# Patient Record
Sex: Female | Born: 1985 | Marital: Married | Smoking: Never smoker
Health system: Southern US, Community
[De-identification: ages and names within clinical notes are randomized; demographics above are authoritative.]

## PROBLEM LIST (undated history)

## (undated) DIAGNOSIS — F53 Postpartum depression: Secondary | ICD-10-CM

## (undated) HISTORY — DX: Postpartum depression: F53.0

---

## 2019-02-18 ENCOUNTER — Ambulatory Visit (INDEPENDENT_AMBULATORY_CARE_PROVIDER_SITE_OTHER): Payer: PRIVATE HEALTH INSURANCE | Admitting: Obstetrics & Gynecology

## 2019-02-18 ENCOUNTER — Other Ambulatory Visit: Payer: Self-pay

## 2019-02-18 ENCOUNTER — Encounter: Payer: Self-pay | Admitting: Obstetrics & Gynecology

## 2019-02-18 DIAGNOSIS — O3680X0 Pregnancy with inconclusive fetal viability, not applicable or unspecified: Secondary | ICD-10-CM

## 2019-02-18 DIAGNOSIS — O2340 Unspecified infection of urinary tract in pregnancy, unspecified trimester: Secondary | ICD-10-CM

## 2019-02-18 DIAGNOSIS — Z23 Encounter for immunization: Secondary | ICD-10-CM

## 2019-02-18 DIAGNOSIS — Z3A17 17 weeks gestation of pregnancy: Secondary | ICD-10-CM

## 2019-02-18 DIAGNOSIS — Z3482 Encounter for supervision of other normal pregnancy, second trimester: Secondary | ICD-10-CM | POA: Diagnosis not present

## 2019-02-18 DIAGNOSIS — O2342 Unspecified infection of urinary tract in pregnancy, second trimester: Secondary | ICD-10-CM

## 2019-02-18 DIAGNOSIS — R519 Headache, unspecified: Secondary | ICD-10-CM

## 2019-02-18 DIAGNOSIS — Z113 Encounter for screening for infections with a predominantly sexual mode of transmission: Secondary | ICD-10-CM

## 2019-02-18 DIAGNOSIS — Z3480 Encounter for supervision of other normal pregnancy, unspecified trimester: Secondary | ICD-10-CM | POA: Insufficient documentation

## 2019-02-18 DIAGNOSIS — Z1151 Encounter for screening for human papillomavirus (HPV): Secondary | ICD-10-CM

## 2019-02-18 DIAGNOSIS — O26899 Other specified pregnancy related conditions, unspecified trimester: Secondary | ICD-10-CM

## 2019-02-18 DIAGNOSIS — O99212 Obesity complicating pregnancy, second trimester: Secondary | ICD-10-CM

## 2019-02-18 DIAGNOSIS — Z124 Encounter for screening for malignant neoplasm of cervix: Secondary | ICD-10-CM

## 2019-02-18 DIAGNOSIS — O26892 Other specified pregnancy related conditions, second trimester: Secondary | ICD-10-CM

## 2019-02-18 DIAGNOSIS — O99210 Obesity complicating pregnancy, unspecified trimester: Secondary | ICD-10-CM

## 2019-02-18 LAB — HEPATITIS B SURFACE ANTIGEN: HEPATITIS B SURFACE AG: NEGATIVE

## 2019-02-18 LAB — CBC
HCT: 33.7
HGB: 11
PLATELET COUNT: 377
WBC: 7.6

## 2019-02-18 LAB — RUBELLA VIRUS ANTIBODIES, IGG, SERUM: RUBELLA IGG QUALITATIVE: IMMUNE

## 2019-02-18 LAB — SYPHILIS SCREENING ALGORITHM WITH REFLEX, SERUM: RPR: NONREACTIVE

## 2019-02-18 LAB — HIV1/HIV2 SCREEN, COMBINED ANTIGEN AND ANTIBODY: HIV SCREEN, COMBINED ANTIGEN & ANTIBODY: NONREACTIVE

## 2019-02-18 NOTE — Progress Notes (Signed)
  Subjective:    Helen Hickman is being seen today for her first obstetrical visit.[id number] LMP 10/20/2018.   This is a planned pregnancy. She is at [redacted]w[redacted]d gestation. Her obstetrical history is significant for obesity. Relationship with FOB: spouse, living together. Patient does intend to breast feed. Pregnancy history fully reviewed.  Pt recently moved here from [place]. She has been here for 1 month.  Pt has 68 and 34 yo daughters. She is in school virtually Facilities manager.    Patient reports no complaints.  Review of Systems:   Review of Systems  Objective:     BP 122/76   Pulse 82   Ht 5\' 8"  (1.727 m)   Wt 251 lb 0.3 oz (113.9 kg)   LMP 10/20/2018   BMI 38.17 kg/m  Physical Exam  Exam General Appearance:    Alert, cooperative, no distress, appears stated age  Head:    Normocephalic, without obvious abnormality, atraumatic  Eyes:    conjunctiva/corneas clear, EOM's intact, both eyes  Ears:    Normal external ear canals, both ears  Nose:   Nares normal, septum midline, mucosa normal, no drainage    or sinus tenderness  Throat:   Lips, mucosa, and tongue normal; teeth and gums normal  Neck:   Supple, symmetrical, trachea midline, no adenopathy;    thyroid:  no enlargement/tenderness/nodules  Back:     Symmetric, no curvature, ROM normal, no CVA tenderness  Lungs:     respirations unlabored  Chest Wall:    No tenderness or deformity   Heart:    Regular rate and rhythm  Breast Exam:    No tenderness, masses, or nipple abnormality  Abdomen:     Soft, non-tender, bowel sounds active all four quadrants,    no masses, no organomegaly  Genitalia:    Normal female without lesion, discharge or tenderness   18 week sized uterus.   Extremities:   Extremities normal, atraumatic, no cyanosis or edema  Pulses:   2+ and symmetric all extremities  Skin:   Skin color, texture, turgor normal, no rashes or lesions     Assessment:    Pregnancy: [id number] Patient Active Problem List   Diagnosis Date Noted  . Supervision of other normal pregnancy, antepartum 02/18/2019  . Maternal obesity, antepartum 02/18/2019  . Headache in pregnancy, antepartum 02/18/2019       Plan:     Initial labs drawn. Prenatal vitamins. Problem list reviewed and updated. AFP3 discussed: requested. Role of ultrasound in pregnancy discussed; fetal survey: requested. Amniocentesis discussed: not indicated. Follow up in 4 weeks. 60% of 30 min visit spent on counseling and coordination of care.  Reviewed that there may be female providers at delivery in an emergency. Pt expressed understanding.  Flu shot today   Lavonia Drafts 02/18/2019

## 2019-02-18 NOTE — Patient Instructions (Addendum)

## 2019-02-18 NOTE — Progress Notes (Signed)
DATING AND VIABILITY SONOGRAM   Helen Hickman is a 33 y.o. year old G22P2012 with LMP Patient's last menstrual period was 10/20/2018 (exact date). which would correlate to  [redacted]w[redacted]d weeks gestation.  She has regular menstrual cycles.   She is here today for a confirmatory initial sonogram.    GESTATION: SINGLETON     FETAL ACTIVITY:          Heart rate         162 bpm          The fetus is active.   ADNEXA: The ovaries are normal.   GESTATIONAL AGE AND  BIOMETRICS:  Gestational criteria: Estimated Date of Delivery: 07/27/19 by LMP now at 108w2d  Previous Scans:0  Femur length  2.49 cm  17.3 weeks                                                                                   AVERAGE EGA(BY THIS SCAN): 17.3weeks  WORKING EDD( LMP ):  07-27-2019     TECHNICIAN COMMENTS: Patient informed that the ultrasound is considered a limited obstetric ultrasound and is not intended to be a complete ultrasound exam. Patient also informed that the ultrasound is not being completed with the intent of assessing for fetal or placental anomalies or any pelvic abnormalities. Explained that the purpose of today's ultrasound is to assess for fetal heart rate. Patient acknowledges the purpose of the exam and the limitations of the study.    Kathrene Alu 02/18/2019 3:50 PM

## 2019-02-20 LAB — CYTOLOGY - PAP
Chlamydia: NEGATIVE
Comment: NEGATIVE
Comment: NEGATIVE
Comment: NORMAL
Diagnosis: NEGATIVE
High risk HPV: NEGATIVE
Neisseria Gonorrhea: NEGATIVE

## 2019-02-22 LAB — URINE CULTURE, OB REFLEX

## 2019-02-22 LAB — CULTURE, OB URINE

## 2019-02-24 ENCOUNTER — Telehealth: Payer: Self-pay

## 2019-02-24 MED ORDER — CEPHALEXIN 500 MG PO CAPS: 500.0000 mg | ORAL_CAPSULE | Freq: Four times a day (QID) | ORAL | 0 refills | Status: AC

## 2019-02-24 NOTE — Telephone Encounter (Signed)
Patient called and made aware of UTI results. Patient verified pharmacy and Keflex sent in per protocol. Kathrene Alu RN

## 2019-02-27 LAB — CYSTIC FIBROSIS GENE TEST

## 2019-02-27 LAB — HEMOGLOBINOPATHY EVALUATION
Ferritin: 41 ng/mL (ref 15–150)
Hgb A2 Quant: 2.4 % (ref 1.8–3.2)
Hgb A: 97.6 % (ref 96.4–98.8)
Hgb C: 0 %
Hgb F Quant: 0 % (ref 0.0–2.0)
Hgb S: 0 %
Hgb Solubility: NEGATIVE
Hgb Variant: 0 %

## 2019-02-27 LAB — OBSTETRIC PANEL, INCLUDING HIV
Antibody Screen: NEGATIVE
Basophils Absolute: 0 10*3/uL (ref 0.0–0.2)
Basos: 0 %
EOS (ABSOLUTE): 0.1 10*3/uL (ref 0.0–0.4)
Eos: 1 %
HIV Screen 4th Generation wRfx: NONREACTIVE
Hematocrit: 33.7 % — ABNORMAL LOW (ref 34.0–46.6)
Hemoglobin: 11 g/dL — ABNORMAL LOW (ref 11.1–15.9)
Hepatitis B Surface Ag: NEGATIVE
Immature Grans (Abs): 0 10*3/uL (ref 0.0–0.1)
Immature Granulocytes: 0 %
Lymphocytes Absolute: 1.6 10*3/uL (ref 0.7–3.1)
Lymphs: 20 %
MCH: 28.5 pg (ref 26.6–33.0)
MCHC: 32.6 g/dL (ref 31.5–35.7)
MCV: 87 fL (ref 79–97)
Monocytes Absolute: 0.4 10*3/uL (ref 0.1–0.9)
Monocytes: 5 %
Neutrophils Absolute: 5.6 10*3/uL (ref 1.4–7.0)
Neutrophils: 74 %
Platelets: 377 10*3/uL (ref 150–450)
RBC: 3.86 x10E6/uL (ref 3.77–5.28)
RDW: 12.3 % (ref 11.7–15.4)
RPR Ser Ql: NONREACTIVE
Rh Factor: POSITIVE
Rubella Antibodies, IGG: 4.22 index (ref >0.99)
WBC: 7.6 10*3/uL (ref 3.4–10.8)

## 2019-02-27 LAB — SMN1 COPY NUMBER ANALYSIS (SMA CARRIER SCREENING)

## 2019-02-27 LAB — AFP, SERUM, OPEN SPINA BIFIDA
AFP MoM: 1.05
AFP Value: 30.2 ng/mL
Gest. Age on Collection Date: 17.2 weeks
Maternal Age At EDD: 34.4 yr
OSBR Risk 1 IN: 10000
Test Results:: NEGATIVE
Weight: 251 [lb_av]

## 2019-02-27 LAB — HEMOGLOBIN A1C
Est. average glucose Bld gHb Est-mCnc: 94 mg/dL
Hgb A1c MFr Bld: 4.9 % (ref 4.8–5.6)

## 2019-03-04 ENCOUNTER — Other Ambulatory Visit: Payer: Self-pay | Admitting: Obstetrics & Gynecology

## 2019-03-04 DIAGNOSIS — O2340 Unspecified infection of urinary tract in pregnancy, unspecified trimester: Secondary | ICD-10-CM

## 2019-03-04 MED ORDER — CEPHALEXIN 500 MG PO CAPS: 500.0000 mg | ORAL_CAPSULE | Freq: Three times a day (TID) | ORAL | 0 refills | Status: AC

## 2019-03-10 ENCOUNTER — Ambulatory Visit (HOSPITAL_COMMUNITY)
Admission: RE | Admit: 2019-03-10 | Discharge: 2019-03-10 | Disposition: A | Discharge status: Home / Self Care | Payer: PRIVATE HEALTH INSURANCE | Source: Ambulatory Visit | Attending: Obstetrics & Gynecology | Admitting: Obstetrics & Gynecology

## 2019-03-10 ENCOUNTER — Other Ambulatory Visit (HOSPITAL_COMMUNITY): Payer: Self-pay | Admitting: *Deleted

## 2019-03-10 ENCOUNTER — Telehealth: Payer: Self-pay

## 2019-03-10 ENCOUNTER — Other Ambulatory Visit: Payer: Self-pay

## 2019-03-10 DIAGNOSIS — O99212 Obesity complicating pregnancy, second trimester: Secondary | ICD-10-CM

## 2019-03-10 DIAGNOSIS — Z3480 Encounter for supervision of other normal pregnancy, unspecified trimester: Secondary | ICD-10-CM | POA: Insufficient documentation

## 2019-03-10 DIAGNOSIS — Z3A20 20 weeks gestation of pregnancy: Secondary | ICD-10-CM

## 2019-03-10 DIAGNOSIS — O99210 Obesity complicating pregnancy, unspecified trimester: Secondary | ICD-10-CM | POA: Insufficient documentation

## 2019-03-10 DIAGNOSIS — Z363 Encounter for antenatal screening for malformations: Secondary | ICD-10-CM | POA: Diagnosis not present

## 2019-03-10 IMAGING — US US MFM OB COMP +14 WKS
1 series · 13 of 28 positions shown · non-contrast
Comparison: none

[Series 1: us mfm ob comp +14 wks · 13 of 67 slices shown]
[im 3/67]
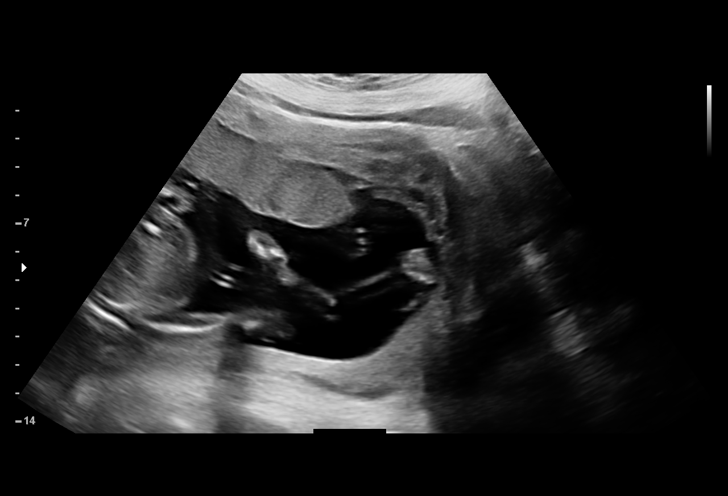
[im 8/67]
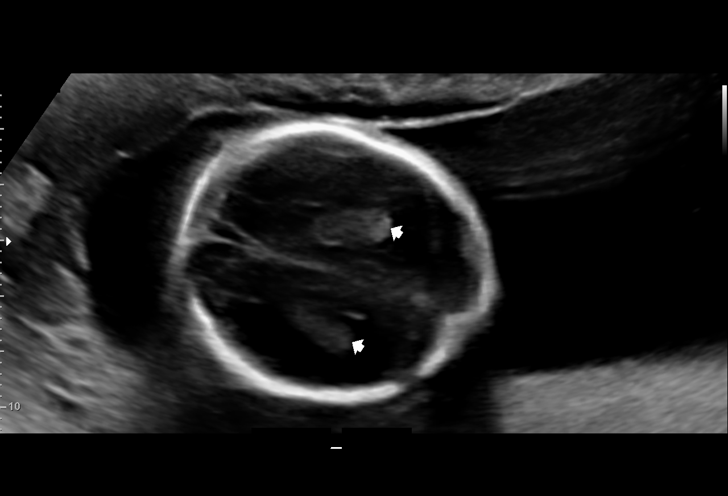
[im 13/67]
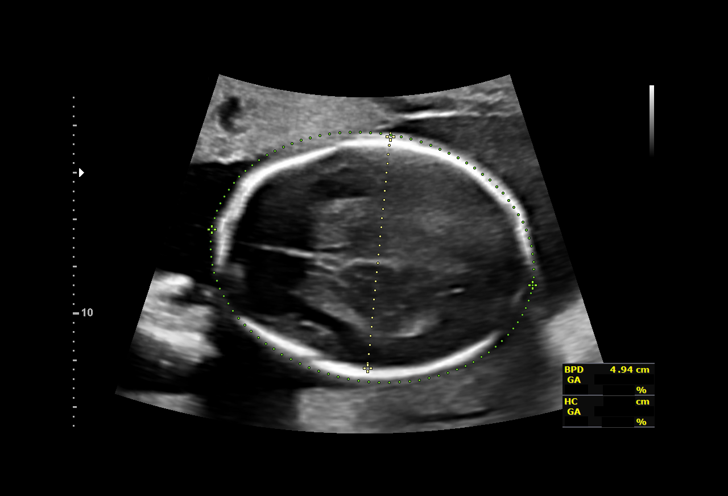
[im 18/67]
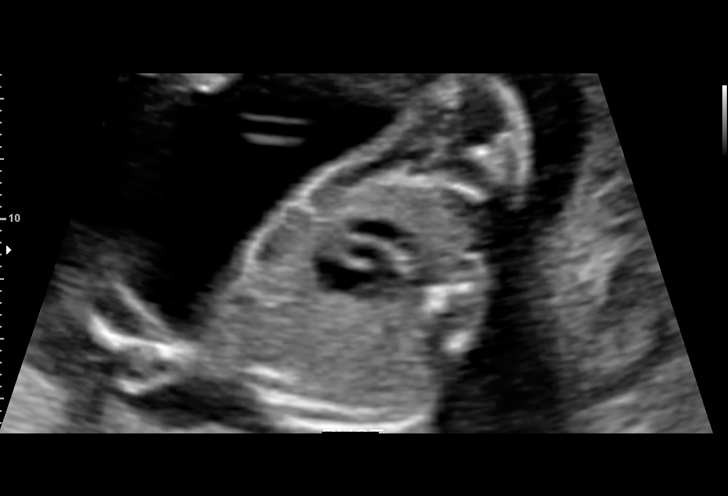
[im 23/67]
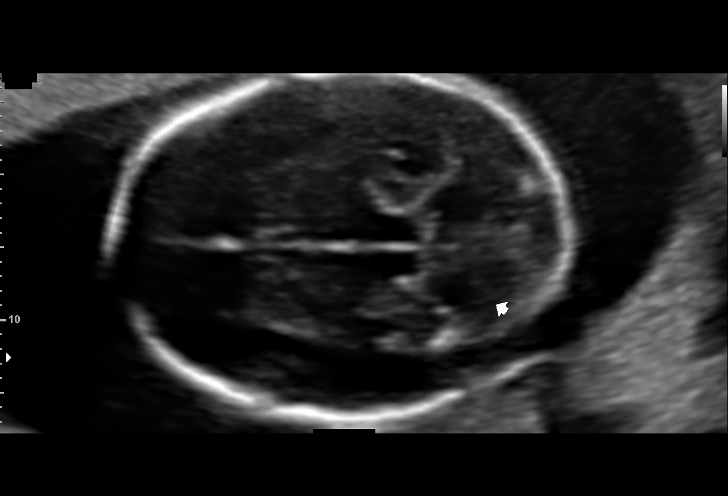
[im 27/67]
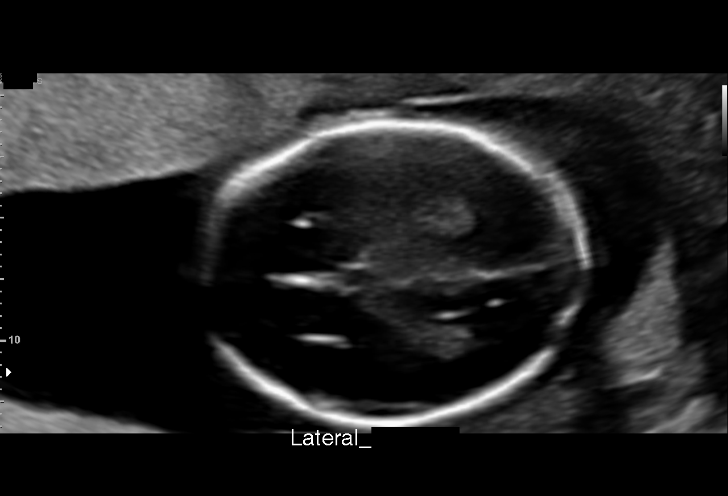
[im 35/67]
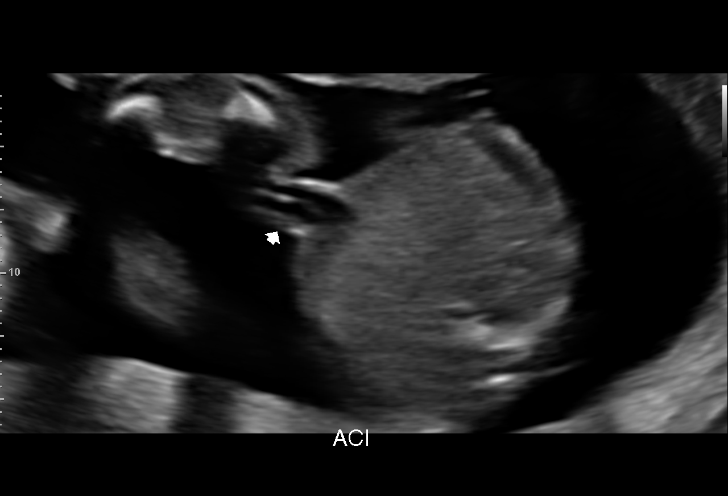
[im 40/67]
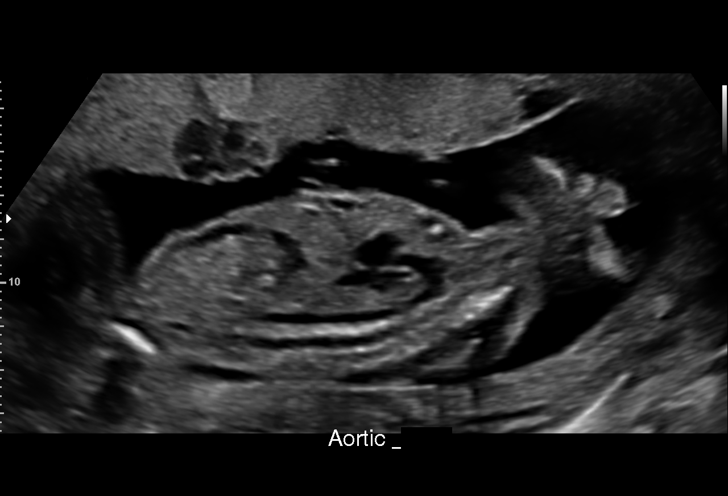
[im 45/67]
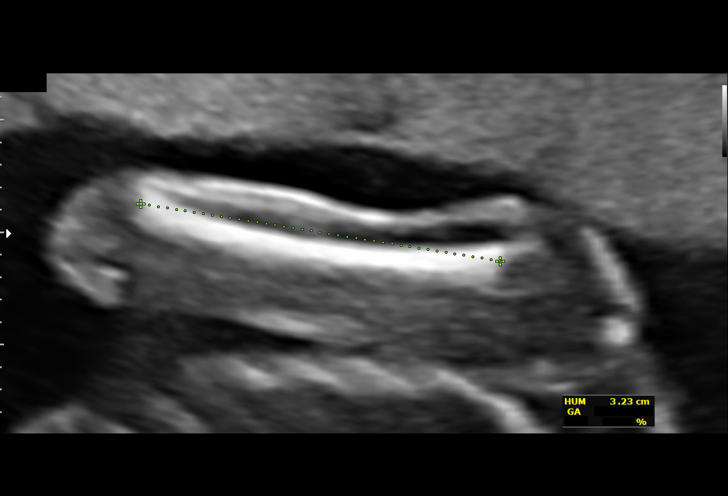
[im 49/67]
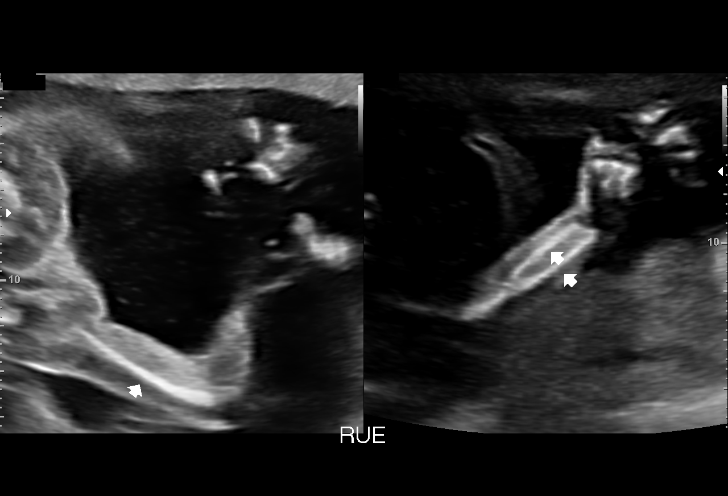
[im 54/67]
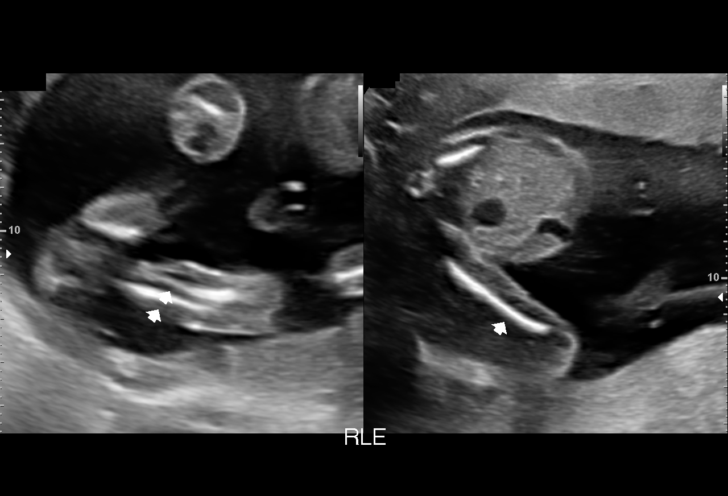
[im 59/67]
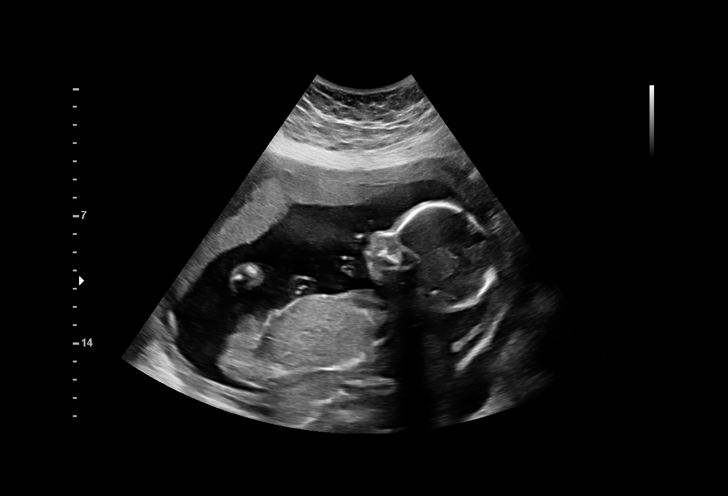
[im 64/67]
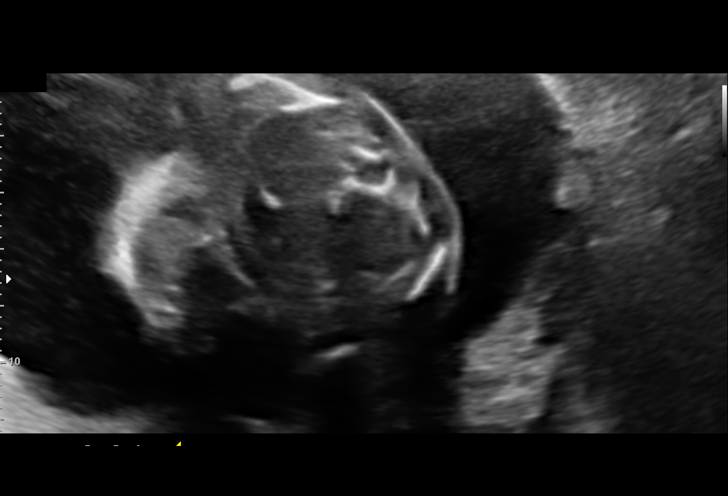

[13 of 28 positions shown; findings below may reference images not displayed]

JOO

                                                       JOO
 ----------------------------------------------------------------------

 ----------------------------------------------------------------------
Indications

  Encounter for antenatal screening for          [NG]
  malformations (Neg AFP)
  20 weeks gestation of pregnancy
  Obesity complicating pregnancy, second         [NG]
  trimester (BMI 38)
 ----------------------------------------------------------------------
Fetal Evaluation

 Num Of Fetuses:         1
 Fetal Heart Rate(bpm):  146
 Cardiac Activity:       Observed
 Presentation:           Breech
 Placenta:               Anterior
 P. Cord Insertion:      Visualized

 Amniotic Fluid
 AFI FV:      Within normal limits

                             Largest Pocket(cm)

Biometry

 BPD:      49.2  mm     G. Age:  20w 6d         79  %    CI:        69.87   %    70 - 86
                                                         FL/HC:      17.8   %    16.8 -
 HC:      187.8  mm     G. Age:  21w 1d         82  %    HC/AC:      1.18        1.09 -
 AC:      159.7  mm     G. Age:  21w 1d         75  %    FL/BPD:     67.9   %
 FL:       33.4  mm     G. Age:  20w 3d         54  %    FL/AC:      20.9   %    20 - 24
 HUM:      32.7  mm     G. Age:  21w 0d         76  %
 CER:      21.8  mm     G. Age:  20w 6d         60  %
 NFT:         4  mm
 LV:        4.3  mm
 CM:        2.9  mm

 Est. FW:     380  gm    0 lb 13 oz      82  %
OB History

 Gravidity:    4         Term:   2         SAB:   1
 Living:       2
Gestational Age

 LMP:           20w 1d        Date:  [DATE]                 EDD:   [DATE]
 U/S Today:     20w 6d                                        EDD:   [DATE]
 Best:          20w 1d     Det. By:  LMP  ([DATE])          EDD:   [DATE]
Anatomy

 Cranium:               Appears normal         Aortic Arch:            Appears normal
 Cavum:                 Appears normal         Ductal Arch:            Appears normal
 Ventricles:            Appears normal         Diaphragm:              Not well visualized
 Choroid Plexus:        Appears normal         Stomach:                Appears normal, left
                                                                       sided
 Cerebellum:            Appears normal         Abdomen:                Appears normal
 Posterior Fossa:       Appears normal         Abdominal Wall:         Appears nml (cord
                                                                       insert, abd wall)
 Nuchal Fold:           Appears normal         Cord Vessels:           Appears normal (3
                                                                       vessel cord)
 Face:                  Appears normal         Kidneys:                Not well visualized
                        (orbits and profile)
 Lips:                  Appears normal         Bladder:                Appears normal
 Thoracic:              Appears normal         Spine:                  Appears normal
 Heart:                 Appears normal         Upper Extremities:      Appears normal
                        (4CH, axis, and
                        situs)
 RVOT:                  Appears normal         Lower Extremities:      Appears normal
 LVOT:                  Appears normal

 Other:  Female gender Heels/feet and open hands/5th digits visualized.
         Technically difficult due to maternal habitus and fetal position.
Cervix Uterus Adnexa

 Cervix
 Length:           4.22  cm.
 Normal appearance by transabdominal scan.
Impression

 We performed fetal anatomy scan. No makers of
 aneuploidies or fetal structural defects are seen. Fetal
 biometry is consistent with her previously-established dates.
 Amniotic fluid is normal and good fetal activity is seen.
 Patient understands the limitations of ultrasound in detecting
 fetal anomalies.
 Maternal obesity imposes limitations on the resolution of
 images, and failure to detect fetal anomalies is more common
 in obese pregnant women.
 I discussed screening for fetal aneuploidies. Patient has a
 prenatal visit appointment next week and will discuss
 screening with you.
Recommendations

 -An appointment was made for her to return in 4 weeks for
 completion of fetal anatomy.
 -Fetal growth assessment at 32 weeks (maternal obesity).
                 JOO

## 2019-03-10 NOTE — Telephone Encounter (Signed)
Called pt regarding Panorama. Pt states she is not able to come in prior to appt for Panorama. Pt states will have labs drawn at office visit on 03/16/18.  Dynastie Knoop l Edouard Gikas, CMA

## 2019-03-10 NOTE — Telephone Encounter (Signed)
-----   Message from Lavonia Drafts, MD sent at 03/10/2019 10:34 AM EST ----- Did this pt get NIPS sent?  I don't see it. Please call her to add. She can come in for the test prior to her next visit.   Thx,  Clh-S

## 2019-03-10 NOTE — Telephone Encounter (Signed)
Pt was confused because the pharmacy called her about her Keflex Rx. Pt made aware that if she has already taken her Keflex, she does not have to take the other Rx. Understanding was voiced.  Helen Hickman, CMA

## 2019-03-13 NOTE — L&D Delivery Note (Signed)
[facility]    Delivery Summary Note      Name: Summer Patel   MRN: [id number]  DOB:  January 11, 1986  Admitted: 07/13/2019 12:19 AM       Pre-delivery diagnosis:    34 y.o. K2I0973 at [redacted]w[redacted]d gestation.   GDMA2  GBS+    Post-delivery diagnosis:    Same plus delivery of a viable neonate.     Findings:           Called to room after patient reported she needed to push. Patient with fetal head crowning and RN at bedside. Tight nuchal noted with spontaneous but slow restitution to ROT. Unable to deliver with gentle guidance and maternal effort. Noted a shoulder dystocia and called out for McRoberts maneuver, Shoulders then delivered with maternal efforts and gentle downward assistance. Baby to maternal abdomen for drying and stimulation. Good tone and cry. Pitocin to IV per protocol. After pulsations of cord ceased, cord double clamped and cut by FOB. Cord blood collected and to table. Placenta delivered by schultz. Complete and intact with 3VC. Fundus firm to massage and pit. Inspection of perineum revealed no lacerations. EBL 250 ml.     Patient somewhat shocked by rapid delivery but doing well. FOB holding baby girl "[name]"    Disposition:  Infant(s) admitted to floor (rooming in with mother).  Team Member Updated:.    "Delivery Information"      IO Blood Loss  07/13/19 0031 - 07/13/19 1326      None                 Carreno, Girl [[id number]]      Labor Events    Rupture date/time: 07/13/2019 1008  Rupture type: Artificial  Fluid color: Clear  Augmentation: AROM              Assisted Delivery Details    Forceps attempted?: No  Vacuum extractor attempted?: No       Shoulder Dystocia Maneuvers    Shoulder dystocia present?: Yes  Gentle attempt at traction, assisted by maternal expulsive forces?: Yes     First maneuver: McRoberts maneuver          Lacerations      Episiotomy: None          Delivery Information    Birth date/time: 07/13/2019 1231  Sex: Female  Delivery type: Vaginal, Spontaneous       Newborn Presentation       Position: Occiput Anterior       Cord Information    Vessels: 3 Vessels  Cord blood disposition: Lab, Cord Segment Sent       Resuscitation    Method: None       Newborn  Apgars    Living status: Living      Skin color:    Heart rate:    Reflex Irrit:    Muscle tone:    Resp. effort:    Total:     1 Min:    0    2    2    2    2    8     5  Min:    1    2    2    2    2    9     10  Min:     15 Min:     20 Min:       Apgars assigned by: RUSH       Skin  to Skin    Skin to skin initiation: 07/13/2019 1312  Skin to skin with: Mother  Breastfeeding initiated: 07/13/2019 1300       Newborn Measurements    Weight: 3790 g  Length: 48.3 cm  Head circumference: 37.5 cm  Chest circumference: 34 cm  Abdominal girth: 35 cm       Placenta    Removal: Spontaneous  Appearance: Delena Bali   Disposition: Discarded       Delivery Providers      Delivering Clinician: Doretha Sou, APRN,MIDWIFE   Provider Role   Jamal Maes, RN Registered Nurse   Fae Pippin, RN Baby Nurse                          Doretha Sou, [place] 07/13/2019, 13:26         I reviewed the APP's note.  I agree with the findings and plan of care as documented in the APP's note.  Any exceptions/additions are edited/noted.    Holley Bouche, MD

## 2019-03-17 ENCOUNTER — Encounter: Payer: PRIVATE HEALTH INSURANCE | Admitting: Advanced Practice Midwife

## 2019-03-24 ENCOUNTER — Encounter: Payer: PRIVATE HEALTH INSURANCE | Admitting: Advanced Practice Midwife

## 2019-04-07 ENCOUNTER — Ambulatory Visit (HOSPITAL_COMMUNITY): Payer: PRIVATE HEALTH INSURANCE

## 2019-04-07 ENCOUNTER — Encounter (HOSPITAL_COMMUNITY): Payer: Self-pay

## 2019-04-09 ENCOUNTER — Telehealth: Payer: Self-pay

## 2019-04-09 ENCOUNTER — Encounter: Payer: PRIVATE HEALTH INSURANCE | Admitting: Family Medicine

## 2019-04-09 NOTE — Telephone Encounter (Signed)
Patient called because she missed her appointment today Jan 28th at 845 am.  Patient called and she states she thought it was tomorrow. I let her know I am unfortunately full tomorrows schedule. Patient states she will call back to reschedule her appointment. Informed her to call as soon as possible to avoid being scheduled much later (since she has now missed a few appointments). Armandina Stammer RN

## 2019-06-05 ENCOUNTER — Encounter (INDEPENDENT_AMBULATORY_CARE_PROVIDER_SITE_OTHER): Payer: Self-pay | Admitting: Advanced Practice Midwife

## 2019-06-09 ENCOUNTER — Ambulatory Visit (INDEPENDENT_AMBULATORY_CARE_PROVIDER_SITE_OTHER): Payer: 59 | Admitting: Primary Care

## 2019-06-09 ENCOUNTER — Other Ambulatory Visit: Payer: Self-pay

## 2019-06-09 ENCOUNTER — Encounter (INDEPENDENT_AMBULATORY_CARE_PROVIDER_SITE_OTHER): Payer: Self-pay | Admitting: Primary Care

## 2019-06-09 ENCOUNTER — Other Ambulatory Visit (INDEPENDENT_AMBULATORY_CARE_PROVIDER_SITE_OTHER): Payer: Self-pay

## 2019-06-09 ENCOUNTER — Encounter (FREE_STANDING_LABORATORY_FACILITY): Payer: 59 | Admitting: Primary Care

## 2019-06-09 ENCOUNTER — Encounter (FREE_STANDING_LABORATORY_FACILITY)
Admit: 2019-06-09 | Discharge: 2019-06-09 | Disposition: A | Discharge status: Home / Self Care | Payer: 59 | Attending: Primary Care | Admitting: Primary Care

## 2019-06-09 VITALS — BP 110/70 | Ht 64.0 in | Wt 283.0 lb

## 2019-06-09 DIAGNOSIS — Z369 Encounter for antenatal screening, unspecified: Secondary | ICD-10-CM | POA: Insufficient documentation

## 2019-06-09 DIAGNOSIS — Z3480 Encounter for supervision of other normal pregnancy, unspecified trimester: Secondary | ICD-10-CM

## 2019-06-09 DIAGNOSIS — Z3A33 33 weeks gestation of pregnancy: Secondary | ICD-10-CM

## 2019-06-09 DIAGNOSIS — Z23 Encounter for immunization: Secondary | ICD-10-CM

## 2019-06-09 DIAGNOSIS — R350 Frequency of micturition: Secondary | ICD-10-CM | POA: Insufficient documentation

## 2019-06-09 DIAGNOSIS — Z113 Encounter for screening for infections with a predominantly sexual mode of transmission: Secondary | ICD-10-CM | POA: Insufficient documentation

## 2019-06-09 DIAGNOSIS — Z363 Encounter for antenatal screening for malformations: Secondary | ICD-10-CM

## 2019-06-09 DIAGNOSIS — Z3483 Encounter for supervision of other normal pregnancy, third trimester: Secondary | ICD-10-CM

## 2019-06-09 DIAGNOSIS — Z3689 Encounter for other specified antenatal screening: Secondary | ICD-10-CM

## 2019-06-09 DIAGNOSIS — Z6710 Type A blood, Rh positive: Secondary | ICD-10-CM | POA: Insufficient documentation

## 2019-06-09 LAB — CBC
HCT: 33.1 % — ABNORMAL LOW (ref 34.8–46.0)
HGB: 10.5 g/dL — ABNORMAL LOW (ref 11.5–16.0)
MCH: 27.9 pg (ref 26.0–32.0)
MCHC: 31.7 g/dL (ref 31.0–35.5)
MCV: 87.8 fL (ref 78.0–100.0)
MPV: 9.8 fL (ref 8.7–12.5)
PLATELETS: 441 10*3/uL — ABNORMAL HIGH (ref 150–400)
RBC: 3.77 10*6/uL — ABNORMAL LOW (ref 3.85–5.22)
RDW-CV: 13.1 % (ref 11.5–15.5)
WBC: 8.6 10*3/uL (ref 3.7–11.0)

## 2019-06-09 LAB — GLUCOSE TOLERANCE TEST (GTT), 1 HOUR: GLUCOSE 1 HR POST DOSE: 217 mg/dL — ABNORMAL HIGH (ref 70–140)

## 2019-06-09 LAB — ABO/RH AND ANTIBODY SCREEN
ABO/RH(D): A POS
ANTIBODY SCREEN: NEGATIVE

## 2019-06-09 NOTE — Nursing Note (Signed)
28 weeks labs drawn and Tdap administered per order Virl Diamond, MA  06/09/2019, 13:42

## 2019-06-09 NOTE — Progress Notes (Addendum)
Obstetrics and [facility] [place] [address]  Phone: [phone]       Chief Complaint   Patient presents with   . Initial Prenatal Visit       HPI: 34 yo G4P2AB1 transferring prenatal care here from [place] and [place]. She is hard to follow, but it sounds as if she may have only had 1 prenatal visit so far this pregnancy. She reports LMP of 10/20/18 which would make her [redacted]w[redacted]d with EDC of 07/27/19.No complaints. Reports fetal movement, denies ROM, vaginal bleeding, contractions. She states she has not completed glucola this pregnancy. She also states she will only see a female provider.     I was able to review outside records prior to her leaving the office. A+, Rubella immune, hep c negative. Rpr negative.  HIV negative. She was treated for UTI.  Hbsag negative. Pap nilm, HPV neg. G/ct negative.   Will do Third trimester labs today and repeat urine culture due to hx of UTI.   From records it does appear she only had 1 visit with them and a "dating" ultrasound was done but she has not had a full anatomy ultrasound. Per records dating sono is consistent with lmp she gave.   OB History   Gravida Para Term Preterm AB Living   4 2 2   1 2    SAB TAB Ectopic Multiple Live Births   1       2      # Outcome Date GA Lbr Len/2nd Weight Sex Delivery Anes PTL Lv   4 Current            3 Term  [redacted]w[redacted]d   F Vag-Spont   LIV   2 Term  [redacted]w[redacted]d   F Vag-Spont   LIV   1 SAB              Genetic Screening    Genetic Screening/Teratology Counseling- Includes patient, baby's father, or anyone in either family with:  Patient's age 25 years or older as of estimated date of delivery: No   Thalassemia ([place], [place], [place], or Asian background): MCV less than 80: No   Neural tube defect (Meningomyelocele, Spina bifida, or Anencephaly): No   Congenital heart defect: No   Down syndrome: No   Tay-Sachs (Ashkenazi Jewish, [place], [place]): No   Canavan disease (Ashkenazi Jewish): No   Familial  dysautonomia (Ashkenazi Jewish): No   Sickle cell disease or trait (African): No   Hemophilia or other blood disorders: No    Cystic fibrosis: No   Glasgow's chorea: No   Mental retardation/autism: No   Other inherited genetic or chromosomal disorder: No   Maternal metabolic disorder (eg. Type 1 diabetes, PKU): No   Patient or baby's father had child with birth defects not listed above: No   Recurrent pregnancy loss, or a stillbirth: No   Medications (including supplements, vitamins, herbs, or OTC drugs)/illicit/recreational drugs/alcohol since last menstrual period: No               ROS: Constitutional: negative  Ears, nose, mouth, throat, and face: negative  Respiratory: negative  Cardiovascular: negative  Gastrointestinal: negative  Genitourinary:negative  Integument/breast: negative  Hematologic/lymphatic: negative  Musculoskeletal:negative  Neurological: negative  Behavioral/Psych: negative  Endocrine: negative  Allergic/Immunologic: negative      Gestational age: [redacted]w[redacted]d    I have reviewed and updated the ACOG as contained in the encounter.    Weight: 128 kg (283 lb)  BP (Non-Invasive): 110/70  Fundal height: 40  Preterm Labor: None  Fetal Movement: Present  Edema: Negative  FHR (1): 160s    Prenatal Physical Exam  No physicals filed.           History reviewed. No pertinent past medical history.  Past Medical History was reviewed and is negative for htn, dm, clotting disorders    Past Surgical History Pertinent Negatives:   Procedure Date Noted   . HX APPENDECTOMY 06/09/2019   . HX BLADDER REPAIR 06/09/2019   . HX BLOOD TRANSFUSION 06/09/2019   . HX BREAST BIOPSY 06/09/2019   . HX CESAREAN SECTION 06/09/2019   . HX CHOLECYSTECTOMY 06/09/2019   . HX CYSTOSCOPY 06/09/2019   . HX HYSTERECTOMY 06/09/2019   . HX TONSILLECTOMY 06/09/2019   . HX TUBAL LIGATION 06/09/2019   . PB POST COLPORRHAPHY,RECTUM/VAGINA 06/09/2019   . PB REPAIR ENTEROCELE,VAG [facility] 06/09/2019   . PB REPR VAGINAL PROLAPSE,SACROSP LIG  06/09/2019   . PB SLING OPER STRES INCONTINENCE 06/09/2019   . URETERAL STENT TO GRAVITY DRAINAGE 06/09/2019         Social History     Socioeconomic History   . Marital status: Married     Spouse name: Not on file   . Number of children: Not on file   . Years of education: Not on file   . Highest education level: Not on file   Tobacco Use   . Smoking status: Never Smoker   . Smokeless tobacco: Never Used   Substance and Sexual Activity   . Alcohol use: Never   . Drug use: Never   . Sexual activity: Yes     Partners: Male     Birth control/protection: None     Social Determinants of Health     Financial Resource Strain:    . Difficulty of Paying Living Expenses:    Food Insecurity:    . Worried About Programme researcher, broadcasting/film/video in the Last Year:    . Barista in the Last Year:    Transportation Needs:    . Freight forwarder (Medical):    Marland Kitchen Lack of Transportation (Non-Medical):    Physical Activity:    . Days of Exercise per Week:    . Minutes of Exercise per Session:    Stress:    . Feeling of Stress :    Intimate Partner Violence:    . Fear of Current or Ex-Partner:    . Emotionally Abused:    Marland Kitchen Physically Abused:    . Sexually Abused:      Family Medical History:     Problem Relation (Age of Onset)    Diabetes Mother, General Family Hx, Other    Heart Disease Father    Hypertension (High Blood Pressure) Father            OB History   Gravida Para Term Preterm AB Living   4 2 2  0 1 2   SAB TAB Ectopic Multiple Live Births   1 0 0 0 2         ICD-10-CM    1. Pregnancy in multigravida  Z34.80 ABO/RH AND ANTIBODY SCREEN     CBC     SYPHILIS SCREENING ALGORITHM WITH REFLEX (TITER, TP-PA), SERUM     GLUCOSE TOLERANCE TEST (GTT), 1 HOUR     OBG TRANSABDOMINAL,AFTER 1ST TRI >14WKS   2. Encounter for antenatal screening of mother  Z36.9 ABO/RH AND ANTIBODY SCREEN  CBC     SYPHILIS SCREENING ALGORITHM WITH REFLEX (TITER, TP-PA), SERUM     GLUCOSE TOLERANCE TEST (GTT), 1 HOUR   3. Increased urinary frequency  R35.0  URINE CULTURE   4. Screening for STD (sexually transmitted disease)  Z11.3 SYPHILIS SCREENING ALGORITHM WITH REFLEX (TITER, TP-PA), SERUM   5. Screening, antenatal, for malformation by ultrasound  Z36.3 OBG US TRANSABDOMINAL,AFTER 1ST TRI >14WKS     Release of records signed and records received prior to her leaving the office.    Third trimester labs today. Tdap given today.  Discussed call coverage and office dynamics, unable to guarantee female provider for delivery. Discussed midwife service at [facility]. States that is too far for her to drive. Will continue care here with [place], if unable to have female at delivery can consider IOL at [facility].   Plan RTC asap for sono growth and anatomy , also fundal height > dates.   Disposition: Return for Sono/ROB asap.    Kennith Maes, APRN,FNP-BC 06/09/2019, 12:04

## 2019-06-09 NOTE — Addendum Note (Signed)
Addended by: Kennith Maes on: 06/09/2019 12:42 PM     Modules accepted: Orders

## 2019-06-10 ENCOUNTER — Encounter (INDEPENDENT_AMBULATORY_CARE_PROVIDER_SITE_OTHER): Payer: Self-pay | Admitting: Primary Care

## 2019-06-10 DIAGNOSIS — Z3490 Encounter for supervision of normal pregnancy, unspecified, unspecified trimester: Secondary | ICD-10-CM | POA: Insufficient documentation

## 2019-06-10 DIAGNOSIS — O24419 Gestational diabetes mellitus in pregnancy, unspecified control: Secondary | ICD-10-CM | POA: Insufficient documentation

## 2019-06-10 DIAGNOSIS — R7309 Other abnormal glucose: Secondary | ICD-10-CM

## 2019-06-10 LAB — SYPHILIS SCREENING ALGORITHM WITH REFLEX (TITER, TP-PA), SERUM: TREPONEMAL AB QUALITATIVE: NONREACTIVE

## 2019-06-10 NOTE — Nursing Note (Signed)
Failed one hour at 217, offered 3hr or fingersticks, advised she would like to do 3hr, will call with appt. Loyal Jacobson, LPN  8/88/9169, 11:20

## 2019-06-11 LAB — URINE CULTURE
URINE CULTURE: 30000
URINE CULTURE: <10000 — AB

## 2019-06-12 ENCOUNTER — Other Ambulatory Visit (INDEPENDENT_AMBULATORY_CARE_PROVIDER_SITE_OTHER): Payer: Self-pay | Admitting: Primary Care

## 2019-06-12 DIAGNOSIS — Z363 Encounter for antenatal screening for malformations: Secondary | ICD-10-CM

## 2019-06-12 DIAGNOSIS — Z3480 Encounter for supervision of other normal pregnancy, unspecified trimester: Secondary | ICD-10-CM

## 2019-06-12 DIAGNOSIS — O99210 Obesity complicating pregnancy, unspecified trimester: Secondary | ICD-10-CM

## 2019-06-16 ENCOUNTER — Other Ambulatory Visit (INDEPENDENT_AMBULATORY_CARE_PROVIDER_SITE_OTHER): Payer: Self-pay | Admitting: Primary Care

## 2019-06-16 ENCOUNTER — Other Ambulatory Visit: Payer: Self-pay

## 2019-06-16 ENCOUNTER — Ambulatory Visit: Payer: 59 | Attending: Primary Care

## 2019-06-16 DIAGNOSIS — R7309 Other abnormal glucose: Secondary | ICD-10-CM | POA: Insufficient documentation

## 2019-06-16 LAB — GLUCOSE 1 HOUR POST DOSE: GLUCOSE 1 HR POST DOSE: 184 mg/dL — ABNORMAL HIGH (ref 120–170)

## 2019-06-16 LAB — GLUCOSE 2 HOUR POST DOSE: GLUCOSE 2 HRS POST  DOSE: 198 mg/dL — ABNORMAL HIGH (ref 70–118)

## 2019-06-16 LAB — GLUCOSE FASTING FOR GTT: GLUCOSE FASTING FOR GTT: 114 mg/dL — ABNORMAL HIGH (ref 70–110)

## 2019-06-16 LAB — GLUCOSE 3 HOUR POST DOSE: GLUCOSE 3 HRS POST  DOSE: 166 mg/dL — ABNORMAL HIGH (ref 70–120)

## 2019-06-16 NOTE — Nursing Note (Signed)
Spoke with pt about failed 3hr, supplies sent to pharmacy, advised how to use, also that she can talk with pharmacist about use or stop in the office is she is uncomfortable, will bring results to appt 06/22/19. Verbalized understanding.   Loyal Jacobson, LPN  11/16/7891, 81:01

## 2019-06-17 ENCOUNTER — Other Ambulatory Visit (INDEPENDENT_AMBULATORY_CARE_PROVIDER_SITE_OTHER): Payer: Self-pay | Admitting: Primary Care

## 2019-06-17 ENCOUNTER — Encounter (INDEPENDENT_AMBULATORY_CARE_PROVIDER_SITE_OTHER): Payer: Self-pay | Admitting: Primary Care

## 2019-06-17 DIAGNOSIS — O24419 Gestational diabetes mellitus in pregnancy, unspecified control: Secondary | ICD-10-CM

## 2019-06-17 MED ORDER — BLOOD SUGAR DIAGNOSTIC STRIPS
ORAL_STRIP | 5 refills | Status: DC
Start: 2019-06-17 — End: 2019-06-22

## 2019-06-17 MED ORDER — LANCING DEVICE WITH LANCETS KIT
PACK | 0 refills | Status: DC
Start: 2019-06-17 — End: 2019-06-22

## 2019-06-17 MED ORDER — BLOOD-GLUCOSE METER
0 refills | Status: DC
Start: 2019-06-17 — End: 2019-06-22

## 2019-06-17 NOTE — Nursing Note (Signed)
Pt is suppose to get a gluclose machine ordered for her   The pharmacy has not gotten the order yet     I spoke to patient, I let her know Kennith Maes CNP just sent that in to the pharmacy, also she put a referral in for a dietician talk with her about diet ect... patient voiced understanding.

## 2019-06-18 ENCOUNTER — Telehealth (INDEPENDENT_AMBULATORY_CARE_PROVIDER_SITE_OTHER): Payer: Self-pay | Admitting: Primary Care

## 2019-06-18 NOTE — Telephone Encounter (Addendum)
Pt was advised of this and she states she will talk about all of this on Monday with Dr.Mace.          ----- Message from Kennith Maes, APRN,FNP-BC sent at 06/18/2019  8:11 AM EDT -----  It is recommended that she get the supplies. We obviously cannot make her. If she has concerns, she has an appointment on 4/12 with Dr. Omar Person.     Kennith Maes, APRN,FNP-BC  06/18/2019, 08:16  ----- Message -----  From: Earl Lites, MA  Sent: 06/17/2019   3:50 PM EDT  To: Kennith Maes, APRN,FNP-BC    Pt called and wanted to know that instead of buying the supplies to test her FS she wants to change her diet first and just check up with [place] each week to make sure her FS is normal.Pt said she is having to pay to much for something she is only going to use it for 3 weeks.Please advise. Thank you.

## 2019-06-19 ENCOUNTER — Encounter (INDEPENDENT_AMBULATORY_CARE_PROVIDER_SITE_OTHER): Payer: Self-pay | Admitting: Primary Care

## 2019-06-19 DIAGNOSIS — B951 Streptococcus, group B, as the cause of diseases classified elsewhere: Secondary | ICD-10-CM | POA: Insufficient documentation

## 2019-06-22 ENCOUNTER — Encounter (INDEPENDENT_AMBULATORY_CARE_PROVIDER_SITE_OTHER): Payer: Self-pay

## 2019-06-22 ENCOUNTER — Encounter (INDEPENDENT_AMBULATORY_CARE_PROVIDER_SITE_OTHER): Payer: Self-pay | Admitting: Obstetrics & Gynecology

## 2019-06-22 ENCOUNTER — Other Ambulatory Visit (INDEPENDENT_AMBULATORY_CARE_PROVIDER_SITE_OTHER): Payer: 59

## 2019-06-22 ENCOUNTER — Other Ambulatory Visit: Payer: Self-pay

## 2019-06-22 ENCOUNTER — Ambulatory Visit (INDEPENDENT_AMBULATORY_CARE_PROVIDER_SITE_OTHER): Payer: 59 | Admitting: Obstetrics & Gynecology

## 2019-06-22 VITALS — BP 124/80 | Wt 285.0 lb

## 2019-06-22 DIAGNOSIS — Z3A35 35 weeks gestation of pregnancy: Secondary | ICD-10-CM

## 2019-06-22 DIAGNOSIS — O99210 Obesity complicating pregnancy, unspecified trimester: Secondary | ICD-10-CM

## 2019-06-22 DIAGNOSIS — R7309 Other abnormal glucose: Secondary | ICD-10-CM

## 2019-06-22 DIAGNOSIS — Z363 Encounter for antenatal screening for malformations: Secondary | ICD-10-CM

## 2019-06-22 DIAGNOSIS — O24419 Gestational diabetes mellitus in pregnancy, unspecified control: Secondary | ICD-10-CM

## 2019-06-22 DIAGNOSIS — Z3480 Encounter for supervision of other normal pregnancy, unspecified trimester: Secondary | ICD-10-CM

## 2019-06-22 DIAGNOSIS — O99213 Obesity complicating pregnancy, third trimester: Secondary | ICD-10-CM

## 2019-06-22 DIAGNOSIS — Z3A38 38 weeks gestation of pregnancy: Secondary | ICD-10-CM

## 2019-06-22 DIAGNOSIS — O99810 Abnormal glucose complicating pregnancy: Secondary | ICD-10-CM

## 2019-06-22 DIAGNOSIS — Z6710 Type A blood, Rh positive: Secondary | ICD-10-CM

## 2019-06-22 MED ORDER — LANCING DEVICE WITH LANCETS KIT
PACK | 0 refills | Status: DC
Start: 2019-06-22 — End: 2019-07-14

## 2019-06-22 MED ORDER — BLOOD-GLUCOSE METER
0 refills | Status: DC
Start: 2019-06-22 — End: 2019-07-14

## 2019-06-22 MED ORDER — BLOOD SUGAR DIAGNOSTIC STRIPS
ORAL_STRIP | 5 refills | Status: DC
Start: 2019-06-22 — End: 2019-07-14

## 2019-06-22 NOTE — Nursing Note (Signed)
Request for High Risk OB consult from: Dr. Omar Person     Reason for request: GDM     Age: 34    Gravida/Para: [id number]     Best EDD: 07/27/19    Current gestational age on: 06/22/19- [redacted]w[redacted]d          Chart information:   Summer Patel was referred by Dr. Omar Person due to GDM.      GDM:    1-hr Glucola 03/30:    217    3-hr GTT 04/06:    Fasting: 114    1-hr: 184    2-hr: 198    3-hr: 166    Note on visit 04/12 states "Has not been checking blood sugars. She stated supplies may be covered by insurance if sent to [facility] pharmacy instead so this was done. She desires IOL prior to 39 weeks. She was advised this can only be done if there is a medical indication and this is a reason for her to be collecting blood sugars. MFM referral placed"   Patient was first seen on 03/30 at [redacted]w[redacted]d as a transfer of care from [facility], note states "She is hard to follow, but it sounds as of she may have only had 1 prenatal visit so far with this pregnancy"    Anatomy scan 04/12- Growth 99%, AC 98%      PMH:    Hx SAB x1:    G1 (No dates listed)    Hx SVD x2:    G2, Term female    G58, Term female    (No dates listed)     Labs, imaging in Epic for review     Schedule:   O MFM consult  O Genetic consult  O Fetal ultrasound      O   singleton    O  Twins   O Triplets   O  _______  O < 14 week   O Nuchal translucency  O Limited  O Anatomy   O Growth (follow up)  O UA Doppler  O MCA Doppler  O Fetal echocardiogram  O Maternal echocardiogram  O Diabetes Education  O Other _______________________________

## 2019-06-22 NOTE — Nursing Note (Signed)
Called to schedule patient for MFM consult. Patient requested appointment in [place], informed her that our only location is [place]. She states she is unable to drive to [facility] and requested a MyChart visit. Scheduled for 04/14 at 9 am.

## 2019-06-22 NOTE — Progress Notes (Signed)
Miriam Liles is a 34 y.o. female currently at [redacted]w[redacted]d      Chief Complaint   Patient presents with    Routine Prenatal Visit   no complaints.  Has not been checking blood sugars. She stated supplies may be covered by insurance if sent to [facility] pharmacy instead so this was done.  She desires iol prior to 39 weeks.  She was advised this can only be done if there is a medical indication and this is a reason for her to be collecting blood sugars.  mfm referral placed.  sono today: growth at 99%; ac98%; largest pocket 7 cm  Will plan for nst and gbs with next visit.    OB History   Gravida Para Term Preterm AB Living   4 2 2  0 1 2   SAB TAB Ectopic Multiple Live Births   1 0 0 0 2       The ACOG flow sheet has been reviewed and updated.     Weight: 129 kg (285 lb)  BP (Non-Invasive): 124/80      ICD-10-CM    1. Pregnancy in multigravida  Z34.80    2. GTT (glucose tolerance test) abnormal  R73.09 FETAL NON STRESS TEST (AMB ONLY)   3. Gestational diabetes mellitus (GDM)  O24.419 Refer to [facility] High Risk OB       Return in about 1 week (around 06/29/2019) for ROB with NST.    07/01/2019, MD 06/22/2019, 10:26

## 2019-06-23 ENCOUNTER — Ambulatory Visit (INDEPENDENT_AMBULATORY_CARE_PROVIDER_SITE_OTHER): Payer: Self-pay | Admitting: Primary Care

## 2019-06-23 ENCOUNTER — Encounter (INDEPENDENT_AMBULATORY_CARE_PROVIDER_SITE_OTHER): Payer: Self-pay | Admitting: Obstetrics & Gynecology

## 2019-06-23 NOTE — Telephone Encounter (Signed)
Spoke with pt about FS values, she is scheduled for appt with MFM tomorrow. Loyal Jacobson, LPN  4/49/6759, 15:49

## 2019-06-23 NOTE — Telephone Encounter (Signed)
-----   Message from [employer] sent at 06/22/2019  4:29 PM EDT -----  Kennith Maes, APRN,FNP-BC Pt     Pt is stating that she would like to speak to a nurse, she states she has to ask a couple questions. Pt didn't give more information. Please advise.    Thank you,  Byrd Hesselbach

## 2019-06-24 ENCOUNTER — Encounter (INDEPENDENT_AMBULATORY_CARE_PROVIDER_SITE_OTHER): Payer: Self-pay

## 2019-06-24 ENCOUNTER — Other Ambulatory Visit: Payer: Self-pay

## 2019-06-24 ENCOUNTER — Ambulatory Visit: Payer: 59 | Attending: Obstetrics & Gynecology

## 2019-06-24 DIAGNOSIS — O360930 Maternal care for other rhesus isoimmunization, third trimester, not applicable or unspecified: Secondary | ICD-10-CM

## 2019-06-24 DIAGNOSIS — Z3A35 35 weeks gestation of pregnancy: Secondary | ICD-10-CM | POA: Insufficient documentation

## 2019-06-24 DIAGNOSIS — O24410 Gestational diabetes mellitus in pregnancy, diet controlled: Secondary | ICD-10-CM

## 2019-06-24 DIAGNOSIS — O24419 Gestational diabetes mellitus in pregnancy, unspecified control: Secondary | ICD-10-CM | POA: Insufficient documentation

## 2019-06-24 DIAGNOSIS — Z6710 Type A blood, Rh positive: Secondary | ICD-10-CM

## 2019-06-24 NOTE — Progress Notes (Signed)
[facility]   MFM Consultation    TELEMEDICINE CONSULTATION    Patient Location: MyChart video visit from home address: [address]  [place] [address]  Patient/family aware of provider location: yes  Patient/family consent for telemedicine: yes  Examination observed and performed by: Sande Rives, MD    GT Modifier -- this consult was performed via telemedicine.  Date of Service: 06/24/2019    I was ask to see this patient because she has gestational diabetes    Impressions    Gestational diabetes diagnosed later in pregnancy   Pregnancy with estimated fetal weight at 99th %tile at 35 0/7 weeks (2 days ago)   Patient just starting checking FSBG yesterday                             Recommendations    Patient to log FSBG for 1 week   Goals of therapy   Fasting < 95 mg/dl   2 hour postprandial < 120 mg/dl   With the patient, we will review her log    If > 50% of glucoses are above target, we will start insulin    If insulin needed   Start antenatal testing twice weekly   Delivery at 38 weeks   If diet controls FSBG, delivery at 39 weeks   1-2 days before scheduled delivery, get ultrasound to assess fetal weight   If EFW > 4500g, offer cesarean delivery    History and pregnancy course reviewed  1 hour GCT on 3/30 = 217  3 hour GTT on 4/6 = 114/184/198/166  Ultrasound on 4/12 -- EFW = 3314g (99th >tile)      Meds  Current Outpatient Medications   Medication Sig    Blood Sugar Diagnostic Strip Fasting and 2hr after meals for a total of 4x daily    Blood-Glucose Meter Misc Device covered by insurance    Lancing Device with Lancets Kit Fasting and 2hr after meals for a total of 4x daily    prenatal vitamin-iron-folate Tablet Take 1 Tablet by mouth Once a day     Allergies --  Allergy History as of 06/24/19      No Known Allergies            No past surgical history on file.    On the day of the encounter, a total of  20 minutes was spent on this  patient encounter including review of historical information, examination, documentation and post-visit activities.       GT Modifier -- this consult was performed via telemedicine.    Kerby Moors, MD

## 2019-06-29 ENCOUNTER — Ambulatory Visit (INDEPENDENT_AMBULATORY_CARE_PROVIDER_SITE_OTHER): Payer: 59 | Admitting: Obstetrics & Gynecology

## 2019-06-29 ENCOUNTER — Encounter (FREE_STANDING_LABORATORY_FACILITY): Payer: 59 | Admitting: Obstetrics & Gynecology

## 2019-06-29 ENCOUNTER — Encounter (FREE_STANDING_LABORATORY_FACILITY)
Admit: 2019-06-29 | Discharge: 2019-06-29 | Disposition: A | Discharge status: Home / Self Care | Payer: 59 | Attending: Obstetrics & Gynecology | Admitting: Obstetrics & Gynecology

## 2019-06-29 ENCOUNTER — Ambulatory Visit (INDEPENDENT_AMBULATORY_CARE_PROVIDER_SITE_OTHER): Payer: 59

## 2019-06-29 ENCOUNTER — Encounter (INDEPENDENT_AMBULATORY_CARE_PROVIDER_SITE_OTHER): Payer: Self-pay

## 2019-06-29 ENCOUNTER — Encounter (INDEPENDENT_AMBULATORY_CARE_PROVIDER_SITE_OTHER): Payer: Self-pay | Admitting: Obstetrics & Gynecology

## 2019-06-29 ENCOUNTER — Other Ambulatory Visit: Payer: Self-pay

## 2019-06-29 VITALS — BP 122/76 | Ht 66.0 in | Wt 283.0 lb

## 2019-06-29 DIAGNOSIS — O24419 Gestational diabetes mellitus in pregnancy, unspecified control: Secondary | ICD-10-CM

## 2019-06-29 DIAGNOSIS — Z369 Encounter for antenatal screening, unspecified: Secondary | ICD-10-CM | POA: Insufficient documentation

## 2019-06-29 DIAGNOSIS — O99810 Abnormal glucose complicating pregnancy: Secondary | ICD-10-CM

## 2019-06-29 DIAGNOSIS — Z3A36 36 weeks gestation of pregnancy: Secondary | ICD-10-CM

## 2019-06-29 DIAGNOSIS — Z3480 Encounter for supervision of other normal pregnancy, unspecified trimester: Secondary | ICD-10-CM

## 2019-06-29 DIAGNOSIS — R7309 Other abnormal glucose: Secondary | ICD-10-CM

## 2019-06-29 NOTE — Progress Notes (Signed)
Summer Patel is a 34 y.o. female currently at [redacted]w[redacted]d      Chief Complaint   Patient presents with    Routine Prenatal Visit   saw mfm, reports blood sugars improved now that she is fasting for Ramadan.  nst reactive  gbs collected.  Cervix closed.  iol scheduled for 5/2 8 pm for gdm.    OB History   Gravida Para Term Preterm AB Living   4 2 2  0 1 2   SAB TAB Ectopic Multiple Live Births   1 0 0 0 2       The ACOG flow sheet has been reviewed and updated.     Weight: 128 kg (283 lb)  BP (Non-Invasive): 122/76      ICD-10-CM    1. Gestational diabetes mellitus (GDM)  O24.419 OBG TRANSABDOMINAL, FOLLOW UP     FETAL NON STRESS TEST (AMB ONLY)   2. Pregnancy in multigravida  Z34.80    3. Prenatal screening encounter  Z36.9 GROUP B STREPTOCOCCUS DNA BY PCR       Return in about 1 week (around 07/06/2019) for ROB with NST.    07/08/2019, MD 06/29/2019, 10:45

## 2019-06-29 NOTE — Progress Notes (Signed)
Reactive nst

## 2019-06-29 NOTE — Procedures (Signed)
NST today in office for gdm:   Baseline: 120  Moderate variability   + Accels  No Decels  Toco: none   Interpretation: reactive NST    Lynden Oxford, MD 06/29/2019, 10:58

## 2019-06-30 ENCOUNTER — Ambulatory Visit (HOSPITAL_COMMUNITY): Payer: Self-pay | Admitting: Registered"

## 2019-07-01 ENCOUNTER — Encounter (INDEPENDENT_AMBULATORY_CARE_PROVIDER_SITE_OTHER): Payer: Self-pay

## 2019-07-01 ENCOUNTER — Other Ambulatory Visit: Payer: Self-pay

## 2019-07-01 ENCOUNTER — Ambulatory Visit: Payer: 59 | Attending: NURSE PRACTITIONER | Admitting: NURSE PRACTITIONER

## 2019-07-01 DIAGNOSIS — O99820 Streptococcus B carrier state complicating pregnancy: Secondary | ICD-10-CM | POA: Insufficient documentation

## 2019-07-01 DIAGNOSIS — Z3A36 36 weeks gestation of pregnancy: Secondary | ICD-10-CM

## 2019-07-01 DIAGNOSIS — O0990 Supervision of high risk pregnancy, unspecified, unspecified trimester: Secondary | ICD-10-CM

## 2019-07-01 DIAGNOSIS — O3663X0 Maternal care for excessive fetal growth, third trimester, not applicable or unspecified: Secondary | ICD-10-CM | POA: Insufficient documentation

## 2019-07-01 DIAGNOSIS — O24410 Gestational diabetes mellitus in pregnancy, diet controlled: Secondary | ICD-10-CM

## 2019-07-01 DIAGNOSIS — O24419 Gestational diabetes mellitus in pregnancy, unspecified control: Secondary | ICD-10-CM | POA: Insufficient documentation

## 2019-07-01 DIAGNOSIS — Z789 Other specified health status: Secondary | ICD-10-CM

## 2019-07-01 DIAGNOSIS — R8271 Bacteriuria: Secondary | ICD-10-CM

## 2019-07-01 DIAGNOSIS — O3660X0 Maternal care for excessive fetal growth, unspecified trimester, not applicable or unspecified: Secondary | ICD-10-CM

## 2019-07-01 LAB — GROUP B STREPTOCOCCUS DNA BY PCR: GROUP B STREPTOCOCCUS (GBS) DNA BY PCR: NEGATIVE

## 2019-07-01 NOTE — Progress Notes (Signed)
[facility] Department of Obstetrics and Gynecology  Maternal-Fetal Medicine Clinic  [address]   [place], [place]  [address]  Phone:  [phone]  Fax:  [phone]    Telemedicine Consultation via [place] Visit    Name: Summer Patel  MRN: [id number]  DOB: 1985/05/15    Date of Service:  07/01/2019     TELEMEDICINE DOCUMENTATION:  Patient Location:  MyChart video visit from home address: [address] [place] [address]   Patient/family aware of provider location:  Yes  Patient/family consent for telemedicine:  Yes  Visit performed and examination observed by: Vivianne Spence, APRN FNP-BC    Patient is a 34 y.o. [id number] at 98w2dfor RFM telemedicine visit.    [facility] [place]    She is seen in the MFM clinic for gestational diabetes and fetal macrosomia.      She denies vaginal bleeding and loss of fluid, and contractions.  Reports good fetal movement.      EDD:  07/27/2019, by Last Menstrual Period    Review of Systems  Constitutional:  Denies fever and weight loss  Eye- Denies blurry vision and vision changes  Cardio- Denies chest pain, palpitations, and irregular heartbeat  Resp- Denies shortness of breath, cough, and wheezing  GI- Denies abdominal pain, nausea, vomiting, diarrhea, and constipation  GU- Denies dysuria and hematuria  Neuro- + intermittent headaches no worse than baseline; reports history of migraines.  Denies syncope    No past medical history on file.    OB History     Gravida   4    Para   2    Term   2    Preterm        AB   1    Living   2       SAB   1    TAB        Ectopic        Multiple        Live Births   2           # Outc Date GA Lbr Len/2nd Wgt Sex Del Anes PTL Lv    1 SAB                 2 Term  335w0d F Vag-Spont   Living        3 Term  391w2dF Vag-Spont   Living        4 Current                     Social History-  Tobacco Use- Denies  Alcohol Use- Denies  THC Use- Denies  Drug Use-  Denies    Allergies-  No Known Allergies    Medications:  Outpatient Medications Marked as Taking for the 07/01/19 encounter (Telemedicine) with Nycole Kawahara, CasGarcenoPRN,FNP-BC   Medication Sig    Blood Sugar Diagnostic Strip Fasting and 2hr after meals for a total of 4x daily    Blood-Glucose Meter Misc Device covered by insOncologistvice with Lancets Kit Fasting and 2hr after meals for a total of 4x daily    prenatal vitamin-iron-folate Tablet Take 1 Tablet by mouth Once a day     Exam  General:  Patient in no acute distress, alert  Psych:  Mood good, calm, cooperative  Assessment/Plan    Gestational Diabetes   Fetal Macrosomia  -Denies history of gestational diabetes  -1 hour gtt 217 on 06/09/2019  -3 hour gtt on 06/16/2019:  Fasting 114, 1 hour 184, 2 hour 198, 3 hour 166  -S/p initial maternal-fetal medicine consultation with Dr. Patric Dykes on 06/24/2019  -Discussed blood glucose goals in pregnancy:   Fasting- <95   2 hours postprandial- <120  -Last ultrasound for fetal growth on 06/22/2019:   EFW- 3314 gm, 99%tile   HC- >99%tile, AC- 98%tile, FL- 93%tile, HUM- 80%tile  -Reports other babies weighted 7.8 lbs and 8 lbs at birth  -No current medication  -Is eating from 7pm-6am  -Fasting is checked around 7-8pm, 2 hours post first meal at 1:30am, and 2 hours post 2nd meal at 6:00am  -Blood glucose readings per log:  06/23/2019- F 80, 2 hr 129, 2 hr 126  06/24/2019- F 90, 2 hr 126, 2hr 90  06/25/2019- F 83, 2 hr 132, 2 hr 124  06/26/2019- F 80, 2 hr 108, 2 hr 130  06/27/2019- F 70, 2 hr, 94, 2 hr 90  06/28/2019- F 75, 2 hr 98, 2 hr 120  06/29/2019- F 75, 2 hr 100, 2 hr 102  06/30/2019- F 80, 2 hr 93, 2 hr 120  -Recommendations/Plan:   Discussed the following:    All fastings are at goal    The last five 2 hours post first meal readings are at goal    5/8 2 hours post second meal readings are not at goal   Discussed bolus insulin therapy with her 2nd meal.  Discussed that first line treatment for uncontrolled blood  glucose readings in pregnancy is insulin therapy.  She declines insulin therapy at this time   Continue to check every fasting and 2 hours post meal reading and keep a log; recommend MyChart video visit in 1 week for blood glucose log review and she is agreeable   Ultrasound for fetal growth every 3-4 weeks; next is scheduled for 07/09/2019.  Per Dr. Patric Dykes, if EFW > 4500 gm, offer cesarean delivery; she should be counseled by primary OB at her appointment if EFW >4500 gm   Twice weekly antenatal surveillance   Dr. Patric Dykes previously recommended deliver at 38 weeks (39 weeks if blood glucose readings are well-controlled).  Agree with plan for delivery at 38 weeks since her readings are not all at goal.  She is currently scheduled for an IOL at [facility] on 07/13/2019 at 8:00pm.  She requests only female providers for her care.  She states today she would like to deliver at [facility] because she was told by primary OB she would be more likely to have care by a female provider here.  She is new to the area and lives with her husband and her 2 daughters (ages 60 and 46).  She asks if her husband and daughters can be with her during induction/labor/postpartum.  I reviewed her situation with Dr. Derrel Nip, who states that she may only have 1 visitor for labor/postpartum and that children are not allowed in the hospital.  I reviewed the visitor policy with [place].  She verbalizes understanding of visitation policy and that her daughters will not be allowed in the hospital.  She requests the 0800 induction on 07/13/2019 but that slot is taken.  IOL scheduled at [facility] on 07/13/2019 at 0000 per her request.  IOL paperwork/instructions were texted to her.     2 hour gtt  at 6 week postpartum visit    GBS Bacteriuria  -Urine culture on 06/09/2019 with GBS bacteria; was treated with Augmentin  -Reviewed preterm labor precautions    Routine Prenatal Care  -Primary OBs are [facility] providers in  [place]  -Prenatal labs:   A positive, antibody screen negative, H/H 11.0/33.7   Rubella immune, RPR non-reactive, HBsAg negative, HIV negative, Gc/chlamydia not detected   A1C 4.9  -Hemoglobinopathy evaluation negative  -Genetic screening:   AFP negative   CF carrier screening negative   SMA carrier screening negative  -Last pap documented as negative with HR HPV negative but no date on result  -Continue prenatal vitamin  -Received Tdap on 06/09/2019  -Reviewed fetal kick counts  -Reviewed preterm labor precautions  -Reviewed s/s preeclampsia and to report if occur    Follow-up with primary OB as directed.  She has an NST only scheduled on 07/06/2019 and an [place] appointment, growth ultrasound, and NST on 07/09/2019    Follow-up with MFM:   MyChart Video Visit on 07/07/2019 to review blood glucose log    I spent greater than 50% of 83 minute telemedicine appointment counseling patient on above plan of care    Patient seen independently with maternal-fetal medicine physician available for consultation.      [employer] APRN FNP-BC

## 2019-07-02 DIAGNOSIS — Z789 Other specified health status: Secondary | ICD-10-CM | POA: Insufficient documentation

## 2019-07-06 ENCOUNTER — Other Ambulatory Visit: Payer: Self-pay

## 2019-07-06 ENCOUNTER — Ambulatory Visit (INDEPENDENT_AMBULATORY_CARE_PROVIDER_SITE_OTHER): Payer: 59

## 2019-07-06 ENCOUNTER — Encounter (INDEPENDENT_AMBULATORY_CARE_PROVIDER_SITE_OTHER): Payer: Self-pay

## 2019-07-06 DIAGNOSIS — O24419 Gestational diabetes mellitus in pregnancy, unspecified control: Secondary | ICD-10-CM

## 2019-07-06 DIAGNOSIS — Z3A37 37 weeks gestation of pregnancy: Secondary | ICD-10-CM

## 2019-07-06 NOTE — Procedures (Signed)
Obstetrics and [facility]  [address]   [place] [address]   Phone: [phone]   NST today:   Baseline: 120  Moderate variability   + Accels to 150s  No Decels  Toco: no contractions   Interpretation: reactive NST    Kennith Maes, APRN,FNP-BC 07/06/2019, 11:59

## 2019-07-07 ENCOUNTER — Ambulatory Visit (INDEPENDENT_AMBULATORY_CARE_PROVIDER_SITE_OTHER): Payer: 59 | Admitting: NURSE PRACTITIONER

## 2019-07-09 ENCOUNTER — Other Ambulatory Visit (INDEPENDENT_AMBULATORY_CARE_PROVIDER_SITE_OTHER): Payer: 59

## 2019-07-09 ENCOUNTER — Ambulatory Visit (INDEPENDENT_AMBULATORY_CARE_PROVIDER_SITE_OTHER): Payer: 59 | Admitting: Nurse Practitioner

## 2019-07-09 ENCOUNTER — Other Ambulatory Visit: Payer: Self-pay

## 2019-07-09 ENCOUNTER — Encounter (INDEPENDENT_AMBULATORY_CARE_PROVIDER_SITE_OTHER): Payer: Self-pay | Admitting: Nurse Practitioner

## 2019-07-09 VITALS — BP 116/78 | Ht 68.0 in | Wt 281.0 lb

## 2019-07-09 DIAGNOSIS — O3660X0 Maternal care for excessive fetal growth, unspecified trimester, not applicable or unspecified: Secondary | ICD-10-CM

## 2019-07-09 DIAGNOSIS — Z3A37 37 weeks gestation of pregnancy: Secondary | ICD-10-CM

## 2019-07-09 DIAGNOSIS — O24419 Gestational diabetes mellitus in pregnancy, unspecified control: Secondary | ICD-10-CM

## 2019-07-09 DIAGNOSIS — B951 Streptococcus, group B, as the cause of diseases classified elsewhere: Secondary | ICD-10-CM

## 2019-07-09 DIAGNOSIS — Z789 Other specified health status: Secondary | ICD-10-CM

## 2019-07-09 DIAGNOSIS — O24410 Gestational diabetes mellitus in pregnancy, diet controlled: Secondary | ICD-10-CM

## 2019-07-09 DIAGNOSIS — O2343 Unspecified infection of urinary tract in pregnancy, third trimester: Secondary | ICD-10-CM

## 2019-07-09 NOTE — Progress Notes (Signed)
NOTE AUTHORED BY Glenis Smoker, MSN, APRN, [facility] - per [facility] requirements.    Chief Complaint   Patient presents with    Routine Prenatal Visit     33yo [phone] in clinic today for ROB visit at [redacted]W[redacted]D in office. Pt denies s/sx UIT/PIH/Labor. Fetal movemenet is present per pt. Pt c/o Braxton hicks contractions, denies ROM/VB. Pt has sch induction at [facility] on 07/13/2019. Growth scan today for GDM. Pt states all BS are within range. Pt desires female providers only.      OB History   Gravida Para Term Preterm AB Living   4 2 2   1 2    SAB TAB Ectopic Multiple Live Births   1       2      # Outcome Date GA Lbr Len/2nd Weight Sex Delivery Anes PTL Lv   4 Current            3 Term  [redacted]w[redacted]d   F Vag-Spont   LIV   2 Term  [redacted]w[redacted]d   F Vag-Spont   LIV   1 SAB              Weight: 127 kg (281 lb)  BP (Non-Invasive): 116/78  Fundal height: 96%  Preterm Labor: None  Fetal Movement: Present  Presentation: Cephalic  Edema: Negative  FHR (1): 137 sono  Comments: Anterior mid grade 0 placenta, nml AFI, EFW 33803gm, 109W3D, liited anatomy 2/2 habitus      ICD-10-CM    1. Fetal macrosomia affecting management of mother, antepartum  O36.60X0    2. Group B Streptococcus urinary tract infection affecting pregnancy in third trimester  O23.43     B95.1    3. Gestational diabetes  O24.419    4. History of religious or cultural beliefs affecting care  Z78.9      Pt ed on fetal kick count, s/sx UTI/PIH/Labor, and when to seek emergent care reviewed. Ed on ligament pain,body mechanics, and encouraged abdominal support band. Pt ed on COVID-19 recommendations on social distancing and need to self quarantine until deliver other than her visits    Disposition: No follow-ups on file.    [redacted]w[redacted]d, NP  07/09/2019, 12:22

## 2019-07-09 NOTE — Nursing Note (Signed)
Per provider the NST did not need to be done today because the patient had an ultrasound Sherre Poot, Ambulatory Care Assistant  07/09/2019, 12:56

## 2019-07-12 ENCOUNTER — Encounter (HOSPITAL_COMMUNITY): Payer: Self-pay

## 2019-07-13 ENCOUNTER — Encounter (HOSPITAL_COMMUNITY): Payer: Self-pay | Admitting: NURSE PRACTITIONER

## 2019-07-13 ENCOUNTER — Encounter (HOSPITAL_COMMUNITY): Payer: Self-pay

## 2019-07-13 ENCOUNTER — Inpatient Hospital Stay
Admission: RE | Admit: 2019-07-13 | Discharge: 2019-07-14 | DRG: 807 | Disposition: A | Discharge status: Home / Self Care | Payer: 59 | Source: Ambulatory Visit | Attending: Obstetrics & Gynecology | Admitting: Obstetrics & Gynecology

## 2019-07-13 ENCOUNTER — Other Ambulatory Visit: Payer: Self-pay

## 2019-07-13 ENCOUNTER — Inpatient Hospital Stay (HOSPITAL_COMMUNITY): Payer: 59 | Admitting: Obstetrics & Gynecology

## 2019-07-13 ENCOUNTER — Inpatient Hospital Stay (HOSPITAL_COMMUNITY): Payer: 59

## 2019-07-13 DIAGNOSIS — Z833 Family history of diabetes mellitus: Secondary | ICD-10-CM

## 2019-07-13 DIAGNOSIS — Z3A38 38 weeks gestation of pregnancy: Secondary | ICD-10-CM

## 2019-07-13 DIAGNOSIS — O2340 Unspecified infection of urinary tract in pregnancy, unspecified trimester: Secondary | ICD-10-CM

## 2019-07-13 DIAGNOSIS — Z20822 Contact with and (suspected) exposure to covid-19: Secondary | ICD-10-CM | POA: Diagnosis present

## 2019-07-13 DIAGNOSIS — O99824 Streptococcus B carrier state complicating childbirth: Secondary | ICD-10-CM | POA: Diagnosis present

## 2019-07-13 DIAGNOSIS — R8271 Bacteriuria: Secondary | ICD-10-CM | POA: Diagnosis present

## 2019-07-13 DIAGNOSIS — O24419 Gestational diabetes mellitus in pregnancy, unspecified control: Secondary | ICD-10-CM

## 2019-07-13 DIAGNOSIS — O24429 Gestational diabetes mellitus in childbirth, unspecified control: Principal | ICD-10-CM | POA: Diagnosis present

## 2019-07-13 DIAGNOSIS — Z79899 Other long term (current) drug therapy: Secondary | ICD-10-CM

## 2019-07-13 DIAGNOSIS — Z3490 Encounter for supervision of normal pregnancy, unspecified, unspecified trimester: Secondary | ICD-10-CM | POA: Diagnosis present

## 2019-07-13 DIAGNOSIS — B951 Streptococcus, group B, as the cause of diseases classified elsewhere: Secondary | ICD-10-CM

## 2019-07-13 LAB — CBC
HCT: 32.5 % — ABNORMAL LOW (ref 34.8–46.0)
HGB: 10.3 g/dL — ABNORMAL LOW (ref 11.5–16.0)
MCH: 27.8 pg (ref 26.0–32.0)
MCHC: 31.7 g/dL (ref 31.0–35.5)
MCV: 87.6 fL (ref 78.0–100.0)
MPV: 10.1 fL (ref 8.7–12.5)
PLATELETS: 424 10*3/uL — ABNORMAL HIGH (ref 150–400)
RBC: 3.71 10*6/uL — ABNORMAL LOW (ref 3.85–5.22)
RDW-CV: 15.2 % (ref 11.5–15.5)
WBC: 5.5 10*3/uL (ref 3.7–11.0)

## 2019-07-13 LAB — DRUG SCREEN, NO CONFIRMATION, URINE
AMPHETAMINES, URINE: NEGATIVE
BARBITURATES URINE: NEGATIVE
BENZODIAZEPINES URINE: NEGATIVE
BUPRENORPHINE URINE: NEGATIVE
CANNABINOIDS URINE: NEGATIVE
COCAINE METABOLITES URINE: NEGATIVE
CREATININE RANDOM URINE: 323 mg/dL — ABNORMAL HIGH (ref 50–100)
ECSTASY/MDMA URINE: NEGATIVE
FENTANYL, RANDOM URINE: NEGATIVE
METHADONE URINE: NEGATIVE
OPIATES URINE (LOW CUTOFF): NEGATIVE
OXYCODONE URINE: NEGATIVE

## 2019-07-13 LAB — SYPHILIS SCREENING ALGORITHM WITH REFLEX (TITER, TP-PA), SERUM: TREPONEMAL AB QUALITATIVE: NONREACTIVE

## 2019-07-13 LAB — POC BLOOD GLUCOSE (RESULTS)
GLUCOSE, POC: 94 mg/dl (ref 70–105)
GLUCOSE, POC: 109 mg/dl — ABNORMAL HIGH (ref 70–105)
GLUCOSE, POC: 89 mg/dl (ref 70–105)

## 2019-07-13 LAB — LIGHT GREEN TOP TUBE

## 2019-07-13 LAB — TYPE AND SCREEN
ABO/RH(D): A POS
ANTIBODY SCREEN: NEGATIVE

## 2019-07-13 LAB — COVID-19 ~~LOC~~ MOLECULAR LAB TESTING: SARS-CoV-2: NOT DETECTED

## 2019-07-13 MED ORDER — MISOPROSTOL 25 MCG VAGINAL QUARTER TAB
25.00 ug | ORAL_TABLET | ORAL | Status: DC | PRN
Start: 2019-07-13 — End: 2019-07-13
  Administered 2019-07-13: 25 ug via VAGINAL
  Filled 2019-07-13: qty 1

## 2019-07-13 MED ORDER — OXYTOCIN 30 UNIT/500 ML IN 0.9 % SODIUM CHLORIDE INTRAVENOUS
334.0000 m[IU]/min | INTRAVENOUS | Status: AC
Start: 2019-07-13 — End: 2019-07-13
  Administered 2019-07-13: 13:00:00 334 m[IU]/min via INTRAVENOUS
  Administered 2019-07-13: 95 m[IU]/min via INTRAVENOUS
  Administered 2019-07-13: 0 m[IU]/min via INTRAVENOUS

## 2019-07-13 MED ORDER — DOCUSATE SODIUM 100 MG CAPSULE
100.00 mg | ORAL_CAPSULE | Freq: Two times a day (BID) | ORAL | Status: DC | PRN
Start: 2019-07-13 — End: 2019-07-14
  Administered 2019-07-13: 100 mg via ORAL
  Filled 2019-07-13: qty 1

## 2019-07-13 MED ORDER — SODIUM CHLORIDE 0.9 % (FLUSH) INJECTION SYRINGE
2.00 mL | INJECTION | INTRAMUSCULAR | Status: DC | PRN
Start: 2019-07-13 — End: 2019-07-14

## 2019-07-13 MED ORDER — MISOPROSTOL 200 MCG TABLET
1000.00 ug | ORAL_TABLET | Freq: Once | ORAL | Status: DC | PRN
Start: 2019-07-13 — End: 2019-07-13

## 2019-07-13 MED ORDER — ACETAMINOPHEN 325 MG TABLET
650.00 mg | ORAL_TABLET | ORAL | Status: DC | PRN
Start: 2019-07-13 — End: 2019-07-14

## 2019-07-13 MED ORDER — LANOLIN-OXYQUIN-PET, HYDROPHIL TOPICAL OINTMENT
TOPICAL_OINTMENT | Freq: Two times a day (BID) | CUTANEOUS | Status: DC | PRN
Start: 2019-07-13 — End: 2019-07-14
  Filled 2019-07-13: qty 1

## 2019-07-13 MED ORDER — INSULIN REGULAR HUMAN 100 UNIT/ML INJECTION SSIP
0.00 [IU] | INJECTION | Freq: Four times a day (QID) | SUBCUTANEOUS | Status: DC | PRN
Start: 2019-07-13 — End: 2019-07-13
  Filled 2019-07-13: qty 3

## 2019-07-13 MED ORDER — SODIUM CHLORIDE 0.9 % (FLUSH) INJECTION SYRINGE
2.00 mL | INJECTION | Freq: Three times a day (TID) | INTRAMUSCULAR | Status: DC
Start: 2019-07-13 — End: 2019-07-13
  Administered 2019-07-13: 06:00:00 0 mL

## 2019-07-13 MED ORDER — IBUPROFEN 600 MG TABLET
600.00 mg | ORAL_TABLET | Freq: Four times a day (QID) | ORAL | Status: DC
Start: 2019-07-13 — End: 2019-07-14
  Administered 2019-07-13 – 2019-07-14 (×3): 600 mg via ORAL
  Administered 2019-07-14: 0 mg via ORAL
  Filled 2019-07-13 (×4): qty 1

## 2019-07-13 MED ORDER — SODIUM CHLORIDE 0.9 % (FLUSH) INJECTION SYRINGE
2.00 mL | INJECTION | INTRAMUSCULAR | Status: DC | PRN
Start: 2019-07-13 — End: 2019-07-13

## 2019-07-13 MED ORDER — LIDOCAINE HCL 10 MG/ML (1 %) INJECTION SOLUTION
2.00 mL | Freq: Once | INTRAMUSCULAR | Status: DC | PRN
Start: 2019-07-13 — End: 2019-07-13

## 2019-07-13 MED ORDER — LACTATED RINGERS IV BOLUS
1000.00 mL | INJECTION | Status: DC | PRN
Start: 2019-07-13 — End: 2019-07-13

## 2019-07-13 MED ORDER — FENTANYL (PF) 50 MCG/ML INJECTION SOLUTION
100.0000 ug | Freq: Once | INTRAMUSCULAR | Status: AC
Start: 2019-07-13 — End: 2019-07-13
  Administered 2019-07-13: 12:00:00 100 ug via INTRAVENOUS
  Filled 2019-07-13: qty 2

## 2019-07-13 MED ORDER — PENICILLIN G POTASSIUM 20 MILLION UNIT SOLUTION FOR INJECTION
2.50 10*6.[IU] | INTRAMUSCULAR | Status: DC
Start: 2019-07-13 — End: 2019-07-13
  Administered 2019-07-13: 0 10*6.[IU] via INTRAVENOUS
  Administered 2019-07-13: 2.5 10*6.[IU] via INTRAVENOUS
  Administered 2019-07-13: 0 10*6.[IU] via INTRAVENOUS
  Administered 2019-07-13: 2.5 10*6.[IU] via INTRAVENOUS
  Filled 2019-07-13 (×3): qty 5

## 2019-07-13 MED ORDER — SODIUM CHLORIDE 0.9 % (FLUSH) INJECTION SYRINGE
2.00 mL | INJECTION | Freq: Three times a day (TID) | INTRAMUSCULAR | Status: DC
Start: 2019-07-13 — End: 2019-07-14
  Administered 2019-07-13 (×2): 0 mL
  Administered 2019-07-14: 2 mL

## 2019-07-13 MED ORDER — GLYCERIN-WITCH HAZEL 12.5 %-50 % TOPICAL PADS
MEDICATED_PAD | CUTANEOUS | Status: DC | PRN
Start: 2019-07-13 — End: 2019-07-14
  Filled 2019-07-13 (×2): qty 40

## 2019-07-13 MED ORDER — CARBOPROST TROMETHAMINE 250 MCG/ML INTRAMUSCULAR SOLUTION
250.00 ug | Freq: Once | INTRAMUSCULAR | Status: DC | PRN
Start: 2019-07-13 — End: 2019-07-13

## 2019-07-13 MED ORDER — SODIUM CHLORIDE 0.9 % (FLUSH) INJECTION SYRINGE
2.00 mL | INJECTION | Freq: Three times a day (TID) | INTRAMUSCULAR | Status: DC
Start: 2019-07-13 — End: 2019-07-13
  Administered 2019-07-13: 0 mL

## 2019-07-13 MED ORDER — LACTATED RINGERS INTRAVENOUS SOLUTION
INTRAVENOUS | Status: DC
Start: 2019-07-13 — End: 2019-07-13

## 2019-07-13 MED ORDER — PRENATAL VIT-IRON-FOLATE TAB WRAPPER
1.00 | ORAL_TABLET | Freq: Every day | Status: DC
Start: 2019-07-14 — End: 2019-07-14
  Administered 2019-07-14: 09:00:00 1 via ORAL
  Filled 2019-07-13: qty 1

## 2019-07-13 MED ORDER — METHYLERGONOVINE 0.2 MG/ML (1 ML) INJECTION SOLUTION
0.20 mg | Freq: Once | INTRAMUSCULAR | Status: DC | PRN
Start: 2019-07-13 — End: 2019-07-13

## 2019-07-13 MED ORDER — SODIUM CHLORIDE 0.9 % (FLUSH) INJECTION SYRINGE
2.00 mL | INJECTION | Freq: Three times a day (TID) | INTRAMUSCULAR | Status: DC
Start: 2019-07-13 — End: 2019-07-14
  Administered 2019-07-13: 14:00:00 0 mL
  Administered 2019-07-13: 21:00:00 2 mL
  Administered 2019-07-14: 0 mL

## 2019-07-13 MED ORDER — PENICILLIN G POTASSIUM 20 MILLION UNIT SOLUTION FOR INJECTION
5.0000 10*6.[IU] | Freq: Once | INTRAVENOUS | Status: AC
Start: 2019-07-13 — End: 2019-07-13
  Administered 2019-07-13: 5 10*6.[IU] via INTRAVENOUS
  Administered 2019-07-13: 0 10*6.[IU] via INTRAVENOUS
  Filled 2019-07-13: qty 10

## 2019-07-13 MED ORDER — OXYTOCIN 30 UNIT/500 ML IN 0.9 % SODIUM CHLORIDE INTRAVENOUS
1.0000 m[IU]/min | INTRAVENOUS | Status: DC
Start: 2019-07-13 — End: 2019-07-13
  Administered 2019-07-13: 06:00:00 1 m[IU]/min via INTRAVENOUS
  Administered 2019-07-13 (×2): 4 m[IU]/min via INTRAVENOUS
  Administered 2019-07-13: 09:00:00 2 m[IU]/min via INTRAVENOUS
  Administered 2019-07-13: 1 m[IU]/min via INTRAVENOUS
  Administered 2019-07-13: 08:00:00 2 m[IU]/min via INTRAVENOUS
  Administered 2019-07-13: 11:00:00 6 m[IU]/min via INTRAVENOUS
  Filled 2019-07-13: qty 500

## 2019-07-13 NOTE — Progress Notes (Signed)
L&D Labor Progress Note: CNM  PATIENT: Summer Patel  CHART NUMBER: [id number]  DoB: [birth year]   DATE OF SERVICE: 07/13/2019, 11:43      S: Patient having much stronger contractions, asking for IV pain medicine if possible.     O:   Filed Vitals:    07/13/19 0930 07/13/19 1009 07/13/19 1030 07/13/19 1111   BP: 120/69  109/61 (!) 119/58   Pulse: 82  75 62   Resp:       Temp:  36.6 C (97.9 F)     SpO2:           SVE: 6/70/-2 clear fluid    Variability: Moderate  Baseline: 138   Accelerations: present  Decelerations: variables with contractions  Toco:q 2-3 min    A/P: 33 y.o. N8G9562 @ [redacted]w[redacted]d     IOL for GDMA2   SVE 6/70/-2  EFM Cat 2  CTX q 2-3 min on Toco  Continue pit; currently at 6 mu/min.  IV fentanyl ordered  Anticipate SVD    Barnetta Chapel, APRN,MIDWIFE  07/13/2019, 11:43        I reviewed the APP's note.  I agree with the findings and plan of care as documented in the APP's note.  Any exceptions/additions are edited/noted.    Delmar Landau, MD

## 2019-07-13 NOTE — Care Plan (Signed)
Problem: Adult Inpatient Plan of Care  Goal: Plan of Care Review  Outcome: Ongoing (see interventions/notes)  Goal: Patient-Specific Goal (Individualized)  Outcome: Ongoing (see interventions/notes)  Goal: Absence of Hospital-Acquired Illness or Injury  Outcome: Ongoing (see interventions/notes)  Goal: Optimal Comfort and Wellbeing  Outcome: Ongoing (see interventions/notes)  Goal: Rounds/Family Conference  Outcome: Ongoing (see interventions/notes)     Problem: Bleeding (Labor)  Goal: Hemostasis  Outcome: Ongoing (see interventions/notes)     Problem: Change in Fetal Wellbeing (Labor)  Goal: Stable Fetal Wellbeing  Outcome: Ongoing (see interventions/notes)     Problem: Delayed Labor Progression (Labor)  Goal: Effective Progression to Delivery  Outcome: Ongoing (see interventions/notes)     Problem: Infection (Labor)  Goal: Absence of Infection Signs and Symptoms  Outcome: Ongoing (see interventions/notes)     Problem: Labor Pain (Labor)  Goal: Acceptable Pain Control  Outcome: Ongoing (see interventions/notes)     Problem: Uterine Tachysystole (Labor)  Goal: Normal Uterine Contraction Pattern  Outcome: Ongoing (see interventions/notes)       Plan of care initiated, pt asked many questions and was satisfied with explanations and voiced understanding, CWRN

## 2019-07-13 NOTE — Progress Notes (Signed)
Summer Patel  [id number]  May 09, 1985  07/13/2019       Department of Obstetric & Gynecology        SUBJECTIVE: Patient states she is having some contractions.      OBJECTIVE:     Filed Vitals:    07/13/19 0127 07/13/19 0147 07/13/19 0439 07/13/19 0440   BP: (!) 98/58   (!) 105/52   Pulse: 96   85   Resp: 18   18   Temp:  36.6 C (97.9 F) 36.6 C (97.9 F)        SVE: 3/50/-2    EFM: 130 baseline/moderate variability/+accels/neg decels  TOCO: q 10 min     A/P: 34 y.o. F7P1025 female at [redacted]w[redacted]d  1.IOL-GDMA2   -SVE: + cervical change   -S/p miso x 1   -Pitocin to be started at this time-Will titrate up accordingly   -FS/SSI  Recent Labs     07/13/19  0225   GLUIP 94    -Cat 1 tracing currently   -GBS neg   -Pain: Does not desire meds at this time   -Anticipate SVD    Campbell Lerner, MD 07/13/2019, 06:01  PGY-3  [facility]  Department of Obstetrics & Gynecology      I saw and examined the patient.  I reviewed the resident's note.  I agree with the findings and plan of care as documented in the resident's note.  Any exceptions/additions are edited/noted.    Lennie Muckle, MD

## 2019-07-13 NOTE — H&P (Signed)
[facility] Department of Obstetric & Gynecology      HISTORY AND PHYSICAL     PATIENT: Summer Patel  CHART NUMBER: [id number]  DATE OF SERVICE: 07/13/2019      PRIMARY OB: OB [facility]      CC: Induction for gestational diabetes     HPI: Summer Patel is a 34 y.o. D3U2025 at 30w0dwho presents to labor and delivery for [facility] Patient states she was supposed to take insulin but declined. States her blood sugars have all been within goal. States her fastings are normally <90 with 2 hour PP mainly < 120. Patient states baby is moving around well. Been experiencing period like pain but no constant contractions. Denies leakage of fluid and vaginal bleeding. Denies headache, chest pain, shortness of breath, and abdominal pain.       ROD:  Dating Summary        Working EDD: 07/27/19 [place] 06/09/19 based on Last Menstrual Period on 10/20/18              Based On EDD GA Diff GA User Date    Last Menstrual Period on 10/20/18   07/27/19 Working  [place]ets, [place]             OB History:  Lab Results   Component Value Date    ABORHD A POSITIVE 06/09/2019    HGB 10.5 (L) 06/09/2019    HCT 33.1 (L) 06/09/2019     Rubella: Immune     HIV: Neg    RPR:  Non reactive      Hep B SAG:  Negative     GBS:  +     GC:  Unable to locate     OB History   Gravida Para Term Preterm AB Living   _0 SAB TAB Ectopic Multiple Live Births   1       2      # Outcome Date GA Lbr Len/2nd Weight Sex Delivery Anes PTL Lv   4 Current            3 Term  371w2d F Vag-Spont   LIV   2 Term  3945w0dF Vag-Spont   LIV   1 SAB                PAST MEDICAL HISTORY:  History reviewed. No pertinent past medical history.    PAST SURGICAL HISTORY:  Past Surgical History Negative    FAMILY HISTORY:   Denies FH of BD, MR, SZs or bleeding/clotting disorders  Family Medical History:       Problem Relation (Age of Onset)    Diabetes Mother, General Family Hx, Other    Heart Disease Father     Hypertension (High Blood Pressure) Father          SOCIAL HISTORY:  Denies drug use  Social History     Tobacco Use    Smoking status: Never Smoker    Smokeless tobacco: Never Used   Vaping Use    Vaping Use: Never used   Substance Use Topics    Alcohol use: Never    Drug use: Never        CURRENT MEDICATIONS:   Medications Prior to Admission       Prescriptions    Blood Sugar Diagnostic Strip    Fasting and 2hr after meals for a total  of 4x daily    Blood-Glucose Meter Misc    Device covered by insurance    Lancing Device with Lancets Kit    Fasting and 2hr after meals for a total of 4x daily    prenatal vitamin-iron-folate Tablet    Take 1 Tablet by mouth Once a day             ALLERGIES:  Patient has no known allergies.     REVIEW OF SYSTEMS: Other than ROS in the HPI, all other systems were negative.    PHYSICAL EXAMINATION:   Filed Vitals:    07/13/19 0127 07/13/19 0147   BP: (!) 98/58    Pulse: 96    Temp:  36.6 C (97.9 F)     General: appears in good health  HENNT: NCAT  Lungs: Clear to auscultation bilaterally.   Cardiovascular: regular rate and rhythm  GI: Soft, non-tender, gravid   Extremities: No cyanosis or edema  Neuro: no deficits noted  Psych: normal affect  Skin: appears well perfused  Psych: normal affect    SVE: 2/50/-3  SSE: deferred   PRESENTATION:  Cephalic by ultrasound  EFW:  EFW 3803 gm 8lb 6oz 96%    FHRT:  120 baseline/moderate variability/+accels/neg decels  TOCO: no ctx noted       ASSESSMENT/PLAN: 34 y.o. N3Z7673 at [redacted]w[redacted]d   1.IOL-GDMA2 Concern for fetal macrosomia  -Admit to L&D  -Diet: Clear liquid  -Continuous Toco/EFM  -CBC,Type and Screen, urine drug screen, L&D eval  -LR @ 125 ml/hour  -Pt FS per goal per pt report; declined insulin during pregnancy  -SSI/FS Q4 in labor  -EFW 8lb 6oz, pelvis previously proven to 8lb 1z with prior child  -IOL started with miso x 1  -Anticipate SVD     2. GBS + Bacteruria  -PCN during labor    Disposition: Admit to L&D for IOL      IJolyn Nap MD  07/13/2019 02:48  PGY-3  [facility] Department of Obstetrics & Gyne[facility]        I saw and examined the patient.  I reviewed the resident's note.  I agree with the findings and plan of care as documented in the resident's note.  Any exceptions/additions are edited/noted.    SAlease Frame MD

## 2019-07-13 NOTE — Progress Notes (Signed)
L&D Labor Progress Note: CNM  PATIENT: Summer Patel  CHART NUMBER: [id number]  DoB: 02-Sep-1985   DATE OF SERVICE: 07/13/2019, 10:14      S: Patient up at side of bed. Has started breathing with contractions. Asking for SVE and possibility of breaking water.     OCeasar Mons Vitals:    07/13/19 0831 07/13/19 0832 07/13/19 0930 07/13/19 1009   BP:  (!) 124/52 120/69    Pulse:  77 82    Resp:  18     Temp: 36.7 C (98.1 F)   36.6 C (97.9 F)   SpO2:  98%         SVE: 4/70/-3 AROM for minimal amount of clear/bloody fluid    Variability: Moderate  Baseline: 127  Accelerations: present  Decelerations: absent  Toco:q 2-3 min    A/P: 34 y.o. U8H7290 @ [redacted]w[redacted]d     IOL for GDMA2   SVE 4/70/-3  AROM for very minimal amount of clear fluid  EFM Cat 1   BG WNL for non fasting levels  CTX q 2-3 min on Toco  Continue pitocin; currently at 2 mu/min.      Barnetta Chapel, APRN,MIDWIFE  07/13/2019, 10:14          I saw and examined the patient.  I reviewed the APP's note.  I agree with the findings and plan of care as documented in the APP's note.  Any exceptions/additions are edited/noted.    Delmar Landau, MD

## 2019-07-13 NOTE — Care Management Notes (Signed)
Late entry:    Pt uncomfortable and delivered infant prior to CM assessment. Will defer assessment until more appropriate time.     Daine Gravel, SOCIAL WORKER  07/13/2019, 14:53

## 2019-07-13 NOTE — Progress Notes (Signed)
[facility]  Induction Bundle Requirements    Date of Service: 07/13/2019     1. Gestational Age:  BELOW 80 WEEKS      Medical Reason for Induction:  Gestational Diabetes     Estimated Fetal Weight (grams):  3800   ULTRASOUND  Date of ultrasound:  4-29    2.  Fetal Monitoring:  REACTIVE NST       Uterine Activity:  2 contractions in 10 minutes.    3.  Pelvic Assessment:    Bishop Score: (Consider cervical ripening if less than 5.)     3/50/-2. Mid, medium    Score: 6       Bishop Scoring System   0 1 2 3    Dilatation 0 1 - 2 3 - 4 5 or more   Effacement 0 - 30 40 - 50 60 - 70 80 or more   Station -3 -2 -1,0 +1,+2   Position Posterior Mid Anterior    Consistency Firm Medium Soft        Fetal Presentation:  CEPHALIC    (Clinical Pelvimetry):  GYNECOID    , MD

## 2019-07-14 ENCOUNTER — Other Ambulatory Visit (HOSPITAL_COMMUNITY): Payer: Self-pay | Admitting: Student in an Organized Health Care Education/Training Program

## 2019-07-14 ENCOUNTER — Encounter (HOSPITAL_COMMUNITY): Payer: Self-pay | Admitting: Obstetrics & Gynecology

## 2019-07-14 LAB — CBC
HCT: 29.6 % — ABNORMAL LOW (ref 34.8–46.0)
HGB: 9.3 g/dL — ABNORMAL LOW (ref 11.5–16.0)
MCH: 28.1 pg (ref 26.0–32.0)
MCHC: 31.4 g/dL (ref 31.0–35.5)
MCV: 89.4 fL (ref 78.0–100.0)
MPV: 9.6 fL (ref 8.7–12.5)
PLATELETS: 305 10*3/uL (ref 150–400)
RBC: 3.31 10*6/uL — ABNORMAL LOW (ref 3.85–5.22)
RDW-CV: 15.2 % (ref 11.5–15.5)
WBC: 7.9 10*3/uL (ref 3.7–11.0)

## 2019-07-14 MED ORDER — POLYETHYLENE GLYCOL 3350 17 GRAM ORAL POWDER PACKET
17.0000 g | Freq: Every day | ORAL | Status: DC
Start: 2019-07-14 — End: 2019-07-14
  Administered 2019-07-14: 09:00:00 17 g via ORAL
  Filled 2019-07-14: qty 1

## 2019-07-14 MED ORDER — FERROUS SULFATE 324 MG (65 MG IRON) TABLET,DELAYED RELEASE
324.0000 mg | DELAYED_RELEASE_TABLET | ORAL | Status: DC
Start: 2019-07-14 — End: 2019-07-14
  Administered 2019-07-14: 324 mg via ORAL
  Filled 2019-07-14: qty 1

## 2019-07-14 MED ORDER — DOCUSATE SODIUM 100 MG CAPSULE
100.00 mg | ORAL_CAPSULE | Freq: Two times a day (BID) | ORAL | Status: DC
Start: 2019-07-14 — End: 2019-07-14
  Administered 2019-07-14: 100 mg via ORAL
  Filled 2019-07-14: qty 1

## 2019-07-14 MED ORDER — FERROUS SULFATE 324 MG (65 MG IRON) TABLET,DELAYED RELEASE
324.00 mg | DELAYED_RELEASE_TABLET | ORAL | 0 refills | Status: DC
Start: 2019-07-16 — End: 2019-07-14

## 2019-07-14 MED ORDER — FERROUS SULFATE 324 MG (65 MG IRON) TABLET,DELAYED RELEASE
324.00 mg | DELAYED_RELEASE_TABLET | ORAL | 0 refills | Status: AC
Start: 2019-07-16 — End: 2019-10-14

## 2019-07-14 MED ORDER — DOCUSATE SODIUM 100 MG CAPSULE
100.00 mg | ORAL_CAPSULE | Freq: Two times a day (BID) | ORAL | 0 refills | Status: DC
Start: 2019-07-14 — End: 2019-07-14

## 2019-07-14 MED ORDER — IBUPROFEN 600 MG TABLET
600.00 mg | ORAL_TABLET | Freq: Four times a day (QID) | ORAL | 0 refills | Status: DC
Start: 2019-07-14 — End: 2019-07-14

## 2019-07-14 MED ORDER — IBUPROFEN 600 MG TABLET
600.00 mg | ORAL_TABLET | Freq: Four times a day (QID) | ORAL | 0 refills | Status: AC
Start: 2019-07-14 — End: 2019-08-17

## 2019-07-14 MED ORDER — DOCUSATE SODIUM 100 MG CAPSULE
100.00 mg | ORAL_CAPSULE | Freq: Two times a day (BID) | ORAL | 0 refills | Status: AC
Start: 2019-07-14 — End: 2019-07-24

## 2019-07-14 NOTE — Care Plan (Signed)
D/c home pending pt adequately recovered from SVD     The patient will continue to be evaluated for developing discharge needs.

## 2019-07-14 NOTE — Discharge Summary (Signed)
[facility]  DISCHARGE SUMMARY    PATIENT NAME:  Mckensi, Redinger  MRN:  [id number]  DOB:  04-Jul-1985    ENCOUNTER DATE:  07/13/2019  INPATIENT ADMISSION DATE: 07/13/2019  DISCHARGE DATE:  07/14/2019    ATTENDING PHYSICIAN: Alease Frame, MD  SERVICE: OBSTETRICS  PRIMARY CARE PHYSICIAN: No Pcp         LAY CAREGIVER:  ,  ,        PRIMARY DISCHARGE DIAGNOSIS:    Active Hospital Problems    Diagnosis Date Noted    SVD (spontaneous vaginal delivery) [O80] 07/13/2019      Resolved Hospital Problems    Diagnosis     Encounter for induction of labor [Z34.90]      Active Non-Hospital Problems    Diagnosis Date Noted    History of religious or cultural beliefs affecting care 07/02/2019    GBS (group B streptococcus) UTI complicating pregnancy 71/08/2692    Gestational diabetes 06/10/2019    Blood type A+ 06/09/2019        DISCHARGE MEDICATIONS:     Current Discharge Medication List        START taking these medications.        Details   docusate sodium 100 mg Capsule  Commonly known as: COLACE   100 mg, Oral, 2 TIMES DAILY  Qty: 20 Each  Refills: 0     ferrous sulfate 324 mg (65 mg iron) Tablet, Delayed Release (E.C.)  Commonly known as: FERATAB  Start taking on: Jul 16, 2019   324 mg, Oral, EVERY OTHER DAY  Qty: 45 Each  Refills: 0     Ibuprofen 600 mg Tablet  Commonly known as: MOTRIN   600 mg, Oral, EVERY 6 HOURS  Qty: 136 Each  Refills: 0            CONTINUE these medications - NO CHANGES were made during your visit.        Details   prenatal vitamin-iron-folate Tablet   1 Tablet, Oral, DAILY  Refills: 0            STOP taking these medications.      Blood Sugar Diagnostic Strip     Blood-Glucose Meter Misc     Lancing Device with Lancets Kit            Discharge med list refreshed?  YES       ALLERGIES:  No Known Allergies      HOSPITAL PROCEDURE(S):   Bedside Procedures:  No orders of the defined types were placed in this encounter.    Surgical     REASON FOR HOSPITALIZATION AND HOSPITAL COURSE     BRIEF HPI:   Maisey Deandrade is a 34 y.o. female is a now [id number] who presented to Labor and Delivery on 07/13/2019 at 66w0dfor [facility] and received penicillin during labor. Pregnancy was supposed to take insulin but declined stating her blood sugars have all been within goal. On 07/13/19 her labor progressed to spontaneous vaginal delivery of a viable infant with APGARs 8&9 at one and five minutes respectively.  There was no lacerations. Her postpartum course was uncomplicated.  Patient able to ambulate and void without difficulty, pain is well controlled, bleeding is minimal.  Patient is breast feeding baby and declined any contraception.  Stable for discharge home on postpartum day 2 with 6 week follow up visit with glucose tolerance test due to GDMA.  TRANSITION/POST DISCHARGE CARE/PENDING TESTS/REFERRALS:     - Follow up with OB in 6 weeks with 2 hour glucose tolerance test    CONDITION ON DISCHARGE:  A. Ambulation: Full ambulation  B. Self-care Ability: Complete  C. Cognitive Status Alert and Oriented x 3  D. Code status at discharge:   Code Status Information       Code Status    Full Code                   LINES/DRAINS/WOUNDS AT DISCHARGE:   Patient Lines/Drains/Airways Status      Active Line / Dialysis Catheter / Dialysis Graft / Drain / Airway / Wound       Name: Placement date: Placement time: Site: Days:    Peripheral IV Ultrasound guided;Extended dwell catheter Left;Lower Cephalic  (lateral side of arm)  07/13/19   0420   1                    DISCHARGE DISPOSITION:  Home discharge              DISCHARGE INSTRUCTIONS:  [facility], Physicians Office Building .    Specialty: Obstetrics & Gynecology  Contact information:  [address]  [place]  [phone]  Additional information:  For driving directions to the Ob/Gyn clinic located in the Physician Office Building, please call [phone]  [phone]). You may also visit our website at [email or web address]                      GLUCOSE TOLERANCE TEST (GTT), 2 HOUR     DISCHARGE INSTRUCTION - MISC    Call [phone] and ask for the [facility] physician on call for any of the following symptoms:  - Fever of 100.5 or higher on two occassions  - Red, warm painful area in either breast  - Urgency, frequency, burning, pain on urination small amounts  - Warm, tender area on either leg  - Feelings of depression, irritability or anxiety  - Vaginal bleeding more than one pad an hour  - All postpartum patients are at risk of developing preeclampsia, a blood pressure disorder unique to pregnancy. Common symptoms include: severe pain in the right upper abdomen, vision changes (spots/ flashes), sudden increase in swelling    Post Partum Instuctions:  - Nothing in the vagina for 6 weeks  - Use ibuprofen (Motrin) to help with cramping pain  - Continue taking your prenatal vitamins  - If you do have intercourse prior to your postpartum visit, use protection!     FOLLOW-UP: OB/GYN - PHYSICIAN OFFICE BLDG - [place], Altha     Follow-up in: [place]    Reason for visit: HOSPITAL DISCHARGE    Follow-up reason: Postpartum Visit                 Ilean China, DO    Copies sent to Care Team         Relationship Specialty Notifications Start End    Pcp, No PCP - General   06/09/19               Referring providers can utilize [email or web address] to access their referred [place] patient's information.                              I saw and examined the patient.  I reviewed the resident's note.  I agree with the findings and plan of care as documented in the resident's note.  Any exceptions/additions are edited/noted.    Lamonte Richer, MD e

## 2019-07-14 NOTE — Progress Notes (Signed)
[facility]      POSTPARTUM VAGINAL DELIVERY PROGRESS NOTE      PATIENT:  Summer Patel   MRN:  [id number]   DATE OF SERVICE:  07/14/2019, 06:45      SUBJECTIVE:  Summer Patel is a 34 y.o. [id number] PPD #1 s/p SVD over no lacerations.  Pt doing well with no complaints. Denies significant pain or cramping. Admits to pelvic pressure but states she hasn't had a BM in days.   Minimal lochia, moderate cramping. Ambulating appropriately. Urinating without difficulty. + flatus, - BM. Tolerating diet with no N/V.  Denies headache, dizziness, fever, chills, CP, SOB or calf tenderness.          OBJECTIVE:   Filed Vitals:    07/13/19 2028 07/14/19 0013 07/14/19 0014 07/14/19 0400   BP: (!) 120/58  122/85 124/82   Pulse: 80  75 74   Resp:    18   Temp:  36.5 C (97.7 F)  36.6 C (97.9 F)   SpO2:           GENERAL:  NAD, well-appearing  CV:  appears well perfused   RESP:  nonlabored breathing on RA  ABD:  Soft, NTTP  FUNDUS:  Firm, below umbilicus  EXT:  No calf tenderness, no edema       ASSESSMENT/PLAN:  34 y.o. [id number] PPD #1    Postpartum Care  - Ambulating without lightheadedness/dizziness  - Urinating spontaneously   - Tolerating PO   - Pain well-controlled   - Infant Care/Feeding - breast  - Contraception - declines  - A POSITIVE - Rhogam not indicated   - Rubella imm - MMR not indicated   - Hb -   10.3 prior to delivery  Pending  postpartum  - Vitals - VSS    GDMA2  - Declines insulin   - GTT postpartum     DISPOSITION:  Anticipate d/c at PPD #1-2. Patient desires discharge.       Paralee Cancel, DO PGY-2  07/14/2019 06:45  [facility]  Department of Obstetrics & Gynecology      I saw and examined the patient.  I reviewed the resident's note.  I agree with the findings and plan of care as documented in the resident's note.  Any exceptions/additions are edited/noted.    Alease Frame, MD

## 2019-07-14 NOTE — Care Management Notes (Signed)
[facility]  Care Management Initial Evaluation    Patient Name: Summer Patel  Date of Birth: 1985-05-25  Sex: female  Date/Time of Admission: 07/13/2019 12:19 AM  Room/Bed: 604/A  Payor: CIGNA / Plan: CIGNA NMC / Product Type: Non Managed Care /   Primary Care Providers:  Pcp, No (General)    Pharmacy Info:   Preferred Pharmacy       [facility] [address] [place] - [address]    [address] [place] [address]    Phone: [phone] Fax: [phone]    Hours: Not open 24 hours          Emergency Contact Info:   Extended Emergency Contact Information  Primary Emergency Contact: Michel Santee  Address: [address]           [place], [place] [address]  Mobile Phone: [phone]  Relation: Husband    History:   Per Chart:  Summer Patel is a 34 y.o. female is a now [id number] who presented to Labor and Delivery on 07/13/2019 at [redacted]w[redacted]d for induction of labor secondary to [facility].   Pt now s/p SVD 07/13/19 w/ baby girl, Summer, rooming in w/ her  Breast feeding dyad    Height/Weight: 172.7 cm (5\' 8" ) / 122 kg (270 lb)     LOS: 1 day   Admitting Diagnosis: Encounter for induction of labor [Z34.90]    Assessment:      07/14/19 1243   Assessment Details   Assessment Type Admission   Date of Care Management Update 07/14/19   Date of Next DCP Update 07/17/19   Readmission   Is this a readmission? No   Care Management Plan   Discharge Planning Status initial meeting   Projected Discharge Date 07/14/19   Discharge plan discussed with: Patient   CM will evaluate for rehabilitation potential no   Patient choice offered to patient/family no   Form for patient choice reviewed/signed and on chart no   Patient aware of possible cost for ambulance transport?  No   Discharge Needs Assessment   Was Referral sent to CPS/CYS? No   Was referral sent to APS? No   Equipment Currently Used at Home none   Equipment Needed After Discharge breast pump  (provided name/number for care [facility] to order breast pump if wishes to have  one post d/c home.  Pt placed name/number in her cell phone)   Discharge Facility/Level of Care Needs Home (Patient/Family Member/other)(code 1)   Transportation Available car;family or friend will provide   Referral Information   Admission Type inpatient   Address Verified verified-no changes   Arrived From admitted as an inpatient   Insurance Verified verified-no change   ADVANCE DIRECTIVES   Does the Patient have an Advance Directive? No, Information Offered and Refused   Patient Requests Assistance in Having Advance Directive Notarized. N/A   LAY CAREGIVER    Appointed Lay Caregiver? I Decline   Employment/Financial   Patient has Prescription Coverage?  Yes        Name of Insurance Coverage for Medications C[employer]   Financial Concerns none   Living Environment   Select an age group to open "lives with" row.  Adult   Lives With spouse;child(ren), dependent   Living Arrangements house   Able to Return to Prior Arrangements yes   Living Arrangement Comments resides in [place], [place] w/ spouse, [place], and their 2 girls ages 42 and 35 y/o w/ stable housing reported  by pt   Home Safety   Home Assessment: No Problems Identified   Home Accessibility no concerns   Legal Issues   Legal Comment infant's name on BC:  Summer Patel   Patient Hand-Off   Clinical/Discharge Plan of Care Information Communicated to:  Clinical Care Coordinator   Comments verbal h/o to [facility]---no d/c needs anticipated     Spoke w/pt, Wayne Sever, on [facility] at bedside room 604 to conduct assessment and review role of care management team.  Newbornm, Summer, resting in bassinet bedside pt's bed.  See chart above for most details  Spouse unable to be present for delivery due to no sitter for their 2 girls ages 48 and 35y/o w/ pt stating it was supposed to be joyous, but was sad day for her due to [place] visitation guidelines.  Emotional support provided.    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  Stable housing, needed infant items obtained,  dependable transportation and strong support system reported by pt.    Verified address/phone:  [address], [place], [place] [address]  Phone [phone]  [facility] [phone]  PCP for children:  [facility]  Insurance:  Christella Scheuermann  Preferred pharmacy:  [facility], [place]  No d/c needs identified at this time  Spouse will provide transportation upon d/c  Discharge Plan:  Home (Patient/Family Member/other) (code 1)  D/c home pending pt adequately recovered from SVD    The patient will continue to be evaluated for developing discharge needs.     Case Manager: Wille Glaser, MSW  Phone: [phone]

## 2019-07-14 NOTE — Nurses Notes (Signed)
Discharge instructions and prescriptions given and reviewed with pt. Pt verbalizes an understanding of instructions. All questions answered.

## 2019-07-14 NOTE — Lactation Note (Signed)
This note was copied from a baby's chart.     07/14/19 1200   Infant   Breastfeeding Observed   Readiness Yes   Rooting Present   Alignment Good   Areolar/Grasp Yes   Suck Pattern Vigorous   Lips Out Yes   Jaw Wide Yes   Breast Feeding Frequency Every 2-3 hours   Breast Feeding Average Minutes 15-25   # of Wet Diapers 3-5 per day   # of Stools 3   Stool Color Meconium   Contentment Post Nursing Yes   Maternal   Maternal Consult Latch observed   Comfort/Pain with Latch Comfortable   Breast Soft;Non-Tender   Lumps Soft   Nipples Non-Tender   Breast Color WNL   Breastfeeding History   # of Children 3   # of Children Breastfed 3   How Long? 1-2 1/2 yrs   Any Problems? no   LATCH Score   Latch 2-->grasps breast, tongue down, lips flanged, rhythmic sucking   Audible Swallowing 2-->spontaneous and intermittent (24 hrs old)   Type of Nipple 2-->everted (after stimulation)   Comfort (Breast/Nipple) 2-->soft/nontender   Hold (Positioning) 2-->no assist from staff, mother able to position/hold infant   Score 10   Lactation Plan   Lactation Consultant at Bedside 1-15 minutes   Mom observed breastfeeding infant, this is her third child to nurse.  Baby latched well, comfortable per mom.   According to chart baby is voiding/ stooling well, nursing every 2-3 h for 15-30 min.  Mom had only one question regarding breastfeeding- asked if she could become pregnant and cont breastfeeding.  Advised it is possible- it is called tandem nursing, many women do this.  She has 3 daughters and dad said she wants to try again for a boy. Mom is attending college but doing online classes.  Denies further questions or concerns.  Will follow

## 2019-07-15 ENCOUNTER — Ambulatory Visit: Payer: Self-pay

## 2019-07-15 NOTE — Lactation Note (Signed)
This note was copied from a baby's chart.     07/15/19 1000   Infant   Breastfeeding Observed   Readiness Yes   Rooting Present   Alignment Good   Areolar/Grasp Yes   Suck Pattern Vigorous   Lips Out Yes   Jaw Wide Yes  (most of the time, nipple sl pinched)   Breast Feeding Frequency Every 2-3 hours   Breast Feeding Average Minutes 10-40   # of Wet Diapers 3-5 per day   # of Stools 4   Stool Color Meconium;Black   Maternal   Maternal Consult Latch observed   Comfort/Pain with Latch Comfortable  (sl pinching with latch)   Breast Soft;Non-Tender;Filling   Lumps Soft   Nipples Pink and Irritated  (nipple sl irritated from shallow latch- advised how to correct)   Breast Color WNL   LATCH Score   Latch 2-->grasps breast, tongue down, lips flanged, rhythmic sucking   Audible Swallowing 2-->spontaneous and intermittent (24 hrs old)   Type of Nipple 2-->everted (after stimulation)   Comfort (Breast/Nipple) 1-->filling, red/small blisters/bruises, mild/mod discomfort   Hold (Positioning) 2-->no assist from staff, mother able to position/hold infant   Score 9   Lactation Plan   Lactation Consultant at Bedside 1-15 minutes   Mom breastfeeding baby during my visit, corrected shallow latch as mom described latch as "pinching a bit".  Gave mom lanolin with instructions on usage, for the soreness and enc mom to correct the shallow latch when it occurs.  No further questions or concerns at this time. Hoping for discharge today.

## 2019-08-03 ENCOUNTER — Emergency Department
Admission: EM | Admit: 2019-08-03 | Discharge: 2019-08-03 | Disposition: A | Discharge status: Home / Self Care | Payer: 59 | Attending: Emergency Medicine | Admitting: Emergency Medicine

## 2019-08-03 ENCOUNTER — Emergency Department (HOSPITAL_COMMUNITY): Payer: 59

## 2019-08-03 ENCOUNTER — Encounter (HOSPITAL_COMMUNITY): Payer: Self-pay

## 2019-08-03 ENCOUNTER — Other Ambulatory Visit: Payer: Self-pay

## 2019-08-03 ENCOUNTER — Encounter (INDEPENDENT_AMBULATORY_CARE_PROVIDER_SITE_OTHER): Payer: Self-pay | Admitting: Nurse Practitioner

## 2019-08-03 DIAGNOSIS — O8620 Urinary tract infection following delivery, unspecified: Secondary | ICD-10-CM

## 2019-08-03 DIAGNOSIS — N390 Urinary tract infection, site not specified: Secondary | ICD-10-CM | POA: Insufficient documentation

## 2019-08-03 DIAGNOSIS — O99893 Other specified diseases and conditions complicating puerperium: Secondary | ICD-10-CM | POA: Insufficient documentation

## 2019-08-03 DIAGNOSIS — N939 Abnormal uterine and vaginal bleeding, unspecified: Secondary | ICD-10-CM | POA: Insufficient documentation

## 2019-08-03 DIAGNOSIS — N83201 Unspecified ovarian cyst, right side: Secondary | ICD-10-CM | POA: Insufficient documentation

## 2019-08-03 DIAGNOSIS — R109 Unspecified abdominal pain: Secondary | ICD-10-CM | POA: Insufficient documentation

## 2019-08-03 LAB — CBC WITH DIFF
BASOPHIL #: 0 10*3/uL (ref 0.00–0.20)
BASOPHIL %: 1 %
EOSINOPHIL #: 0.2 10*3/uL (ref 0.00–0.50)
EOSINOPHIL %: 3 %
HCT: 36.4 % (ref 34.6–46.2)
HGB: 12.4 g/dL (ref 11.8–15.8)
LYMPHOCYTE #: 1.8 10*3/uL (ref 0.90–3.40)
LYMPHOCYTE %: 25 %
MCH: 28.7 pg (ref 27.6–33.2)
MCHC: 34 g/dL (ref 32.6–35.4)
MCV: 84.5 fL (ref 82.3–96.7)
MONOCYTE #: 0.4 10*3/uL (ref 0.20–0.90)
MONOCYTE %: 6 %
MPV: 7.5 fL (ref 6.6–10.2)
NEUTROPHIL #: 4.5 10*3/uL (ref 1.50–6.40)
NEUTROPHIL %: 65 %
PLATELETS: 393 10*3/uL (ref 140–440)
RBC: 4.31 10*6/uL (ref 3.80–5.24)
RDW: 14.7 % (ref 12.4–15.2)
WBC: 6.9 10*3/uL (ref 3.5–10.3)

## 2019-08-03 LAB — URINALYSIS, MACRO/MICRO
BILIRUBIN: NEGATIVE mg/dL
COLOR: NORMAL
GLUCOSE: NORMAL mg/dL
KETONES: NEGATIVE mg/dL
LEUKOCYTES: 500 WBCs/uL — AB
NITRITE: NEGATIVE
PH: 5 (ref 5.0–8.0)
PROTEIN: NEGATIVE mg/dL
SPECIFIC GRAVITY: 1.005 (ref 1.005–1.030)
UROBILINOGEN: NORMAL mg/dL

## 2019-08-03 LAB — BASIC METABOLIC PANEL
ANION GAP: 10 mmol/L
BUN/CREA RATIO: 27
BUN: 17 mg/dL (ref 10–25)
CALCIUM: 9.4 mg/dL (ref 8.8–10.3)
CHLORIDE: 105 mmol/L (ref 98–111)
CO2 TOTAL: 24 mmol/L (ref 21–35)
CREATININE: 0.63 mg/dL (ref <=1.30)
GLUCOSE: 91 mg/dL (ref 70–110)
POTASSIUM: 4.1 mmol/L (ref 3.5–5.0)
SODIUM: 139 mmol/L (ref 135–145)

## 2019-08-03 LAB — HEPATIC FUNCTION PANEL
ALBUMIN: 4 g/dL (ref 3.2–4.6)
ALKALINE PHOSPHATASE: 88 U/L (ref 20–130)
ALT (SGPT): 22 U/L (ref <=52)
AST (SGOT): 23 U/L (ref <=35)
BILIRUBIN DIRECT: 0.1 mg/dL (ref <=0.3)
BILIRUBIN TOTAL: 0.2 mg/dL — ABNORMAL LOW (ref 0.3–1.2)
PROTEIN TOTAL: 6.9 g/dL (ref 6.0–8.3)

## 2019-08-03 LAB — PROTEIN/CREATININE RATIO, URINE, RANDOM
CREATININE RANDOM URINE: 19 mg/dL
PROTEIN RANDOM URINE: 13 mg/dL (ref <=200)
PROTEIN/CREATININE RATIO RANDOM URINE: 0.684 mg/mg (ref 0.000–200.000)

## 2019-08-03 LAB — LIPASE: LIPASE: 23 U/L (ref <=82)

## 2019-08-03 LAB — HCG, PLASMA OR SERUM QUANTITATIVE, PREGNANCY: HCG QUANTITATIVE PREGNANCY: 3 IU/L

## 2019-08-03 MED ORDER — CEFDINIR 300 MG CAPSULE
300.00 mg | ORAL_CAPSULE | Freq: Two times a day (BID) | ORAL | 0 refills | Status: AC
Start: 2019-08-03 — End: 2019-08-13

## 2019-08-03 MED ORDER — CEFDINIR 300 MG CAPSULE
300.0000 mg | ORAL_CAPSULE | ORAL | Status: AC
Start: 2019-08-03 — End: 2019-08-03
  Administered 2019-08-03: 300 mg via ORAL
  Filled 2019-08-03 (×2): qty 1

## 2019-08-03 MED ORDER — CEFDINIR 300 MG CAPSULE
300.00 mg | ORAL_CAPSULE | Freq: Two times a day (BID) | ORAL | 0 refills | Status: DC
Start: 2019-08-03 — End: 2019-08-03

## 2019-08-03 MED ORDER — ACETAMINOPHEN 500 MG TABLET
1000.00 mg | ORAL_TABLET | ORAL | Status: AC
Start: 2019-08-03 — End: 2019-08-03
  Administered 2019-08-03: 1000 mg via ORAL

## 2019-08-03 MED ORDER — SODIUM CHLORIDE 0.9 % INJECTION SOLUTION
10.00 mL | INTRAMUSCULAR | Status: DC
Start: 2019-08-03 — End: 2019-08-03

## 2019-08-03 MED ORDER — IOVERSOL 320 MG IODINE/ML INTRAVENOUS SOLUTION
120.00 mL | INTRAVENOUS | Status: AC
Start: 2019-08-03 — End: 2019-08-03
  Administered 2019-08-03: 120 mL via INTRAVENOUS

## 2019-08-03 NOTE — ED Attending Note (Signed)
[facility] - Emergency Department  Primary Attending Note    Name: Summer Patel  Age and Gender: 34 y.o. female  Date of Birth: Mar 23, 1985  Date of Service: 08/03/2019   MRN: [id number]  PCP: No Pcp    I was physically present and directly supervised this patients care. Patient seen and examined with the resident, Dr. Aundria Rud, and history and exam reviewed. Key elements in addition to and/or correction of that documentation are as follows:    Chief Complaint   Patient presents with    Postpartum Complication     Reports having pain s/p pregnancy x 3 weeks ago.  Pt reports bleeding more than normal.  States sharpe pain       HPI:  Summer Patel is a 34 y.o. female presenting with left flank pain.   Started about 1 hour prior to arrival.  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   Also reports increase in her vaginal bleeding.   The patient is 3 weeks postpartum after a vaginal delivery at 15 weeks for gestational diabetes.   No fevers or chills.  No nausea or vomiting.    Further historical details can be found in the resident note.    Pertinent past medical, past surgical, family, social histories as well as home medications and allergies were reviewed with patient and/or EMR, see resident note/EMR for full details.    Objective:  ED Triage Vitals [08/03/19 1727]   BP (Non-Invasive) (!) 167/11   Heart Rate 74   Respiratory Rate 18   Temp    SpO2 97 %   Weight 122 kg (270 lb)   Height 1.727 m (_0 )     Physical Exam:  34 y.o. female who appears stated age in good health and in no distress. Normal color, no cyanosis.  Trace ankle edema bilaterally.  I have seen and physically examined the patient.  I agree with the physical exam as documented in Dr. Vinson Moselle note.    Labs:   Labs Reviewed   HEPATIC FUNCTION PANEL - Abnormal; Notable for the following components:       Result Value    BILIRUBIN TOTAL 0.2 (*)     All other components within normal limits   URINALYSIS, MACRO/MICRO -  Abnormal; Notable for the following components:    LEUKOCYTES 500  (*)     BLOOD 3+ (*)     RBCS 30-50 (*)     WBCS 75-100 (*)     All other components within normal limits   LIPASE - Normal   BASIC METABOLIC PANEL    Narrative:     Estimated Glomerular Filtration Rate (eGFR) calculated using the CKD-EPI (2009) equation, intended for patients 69 years of age and older. If race and/or gender is not documented or "unknown," there will be no eGFR calculation.   CBC/DIFF    Narrative:     The following orders were created for panel order CBC/DIFF.  Procedure                               Abnormality         Status                     ---------                               -----------         ------  CBC WITH WNIO[[id number]]                                    Final result                 Please view results for these tests on the individual orders.   URINALYSIS WITH REFLEX MICROSCOPIC AND CULTURE IF POSITIVE    Narrative:     The following orders were created for panel order URINALYSIS WITH REFLEX MICROSCOPIC AND CULTURE IF POSITIVE.  Procedure                               Abnormality         Status                     ---------                               -----------         ------                     URINALYSIS, MACRO/MICRO[[id number]]      Abnormal            Final result                 Please view results for these tests on the individual orders.   HCG, PLASMA OR SERUM QUANTITATIVE, PREGNANCY   CBC WITH DIFF   PROTEIN/CREATININE RATIO, URINE, RANDOM       Imaging:  CT ABDOMEN PELVIS W IV CONTRAST   Final Result by Edi, Radresults In (05/24 1958)   1. No acute abnormality. No inflammatory changes.   2. Enlargement of the right hepatic lobe.         Radiologist location ID: [id number]         US KIDNEY   Final Result by Edi, Radresults In (05/24 1903)   1. No sonographic abnormality of the kidneys or bladder.   2. Enlargement of the right hepatic lobe.         Radiologist location ID: [id number]          [place] FEMALE PELVIS   Final Result by Edi, Radresults In (05/24 1902)   1. No definite abnormality of the endometrium.   2. Small right ovarian cyst.         Radiologist location ID: [id number]           MDM/Course:  Summer Patel is a 34 y.o. female who presented with left flank pain.     Patient has a medium likelihood of decompensation due to their current condition.    Diagnostic evaluation reviewed.  No acute concerning findings were identified on serum testing.  Urinalysis and imaging pending at the time of checkout.   Patient remained stable while in the emergency department under my care with no signs of significant blood loss.         Medications given:  Medications   acetaminophen (TYLENOL) tablet (1,000 mg Oral Given 08/03/19 1948)   ioversol (OPTIRAY 320) infusion (120 mL Intravenous Given 08/03/19 1937)   cefdinir (OMNICEF) capsule (300 mg Oral Given 08/03/19 2043)     Clinical Impression:     Encounter Diagnoses   Name Primary?    Left flank pain  Yes    Vaginal bleeding     UTI (urinary tract infection)      At the end of my shift care of Summer Patel was checked out to Dr. Dava Najjar at [phone] following a discussion of the patient's course. Please refer to Dr. Fulton Mole attending course note for further details of the patient's ED course.    Disposition: Disposition pending results of imaging and UA. Please see Dr. Fulton Mole attending course note for further details.    Parts of this patients chart were completed in a retrospective fashion due to simultaneous direct patient care activities in the Emergency Department.       Pamalee Leyden, MD  08/03/2019, 17:38  Assistant Professor  [facility] Department of Emergency Medicine

## 2019-08-03 NOTE — ED Attending Handoff Note (Signed)
Chief Complaint   Patient presents with   . Postpartum Complication     Reports having pain s/p pregnancy x 3 weeks ago.  Pt reports bleeding more than normal.  States sharpe pain     BP 93/64   Pulse 69   Resp 13   Ht 1.727 m (5\' 8" )   Wt 122 kg (270 lb)   LMP 10/20/2018   SpO2 98%   BMI 41.05 kg/m     Results up to the Time the Disposition was Entered   CBC/DIFF    Narrative:     The following orders were created for panel order CBC/DIFF.  Procedure                               Abnormality         Status                     ---------                               -----------         ------                     CBC WITH 12/20/2018                                    Final result                 Please view results for these tests on the individual orders.   CBC WITH DIFF   BASIC METABOLIC PANEL   URINALYSIS WITH REFLEX MICROSCOPIC AND CULTURE IF POSITIVE    Narrative:     The following orders were created for panel order URINALYSIS WITH REFLEX MICROSCOPIC AND CULTURE IF POSITIVE.  Procedure                               Abnormality         Status                     ---------                               -----------         ------                     URINALYSIS, MACRO/MICRO[[id number]]                                                       Please view results for these tests on the individual orders.   HCG, PLASMA OR SERUM QUANTITATIVE, PREGNANCY   HEPATIC FUNCTION PANEL   LIPASE   URINALYSIS, MACRO/MICRO   PROTEIN/CREATININE RATIO, URINE, RANDOM   UXNA[[id number]] KIDNEY   US FEMALE PELVIS     dispo = labs, imaging    [place], MD  08/03/2019, 18:31  [facility]    ADDENDUM:  Urinalysis indicative of urinary tract  infection.  Baseline lab work otherwise unremarkable.  CT abdomen pelvis shows no acute intra-abdominal pelvis.  Large recurrent right pack lobe is present.  Ultrasound of kidneys without abnormality.  Ultrasound pelvis shows no definitive abnormality in the endometrium.   Results were discussed with patient.  She was given the opportunity to ask questions.  While in the emergency department, patient given dose of Tylenol and cefdinir.  Prescription written for cefdinir.  Will treat as pyelonephritis given left flank pain.  All questions answered.  Patient agreeable to plan.    Ellan Lambert, MD  08/03/2019, [phone]  [place]    Parts of this patient's chart was completed in a retrospective fashion due to simultaneous direct patient care in the Emergency Department.   This note was partially generated using MModal Fluency Direct system, and there may be some incorrect syntax, spelling, and punctuation that were not noted before saving. In such instances, original meaning may be extrapolated by contextual derivation.

## 2019-08-03 NOTE — ED Provider Notes (Signed)
[facility]  Emergency Department  Provider Note    Name: Summer Patel  Age and Gender: 34 y.o. female  Date of Birth: 12/06/1985  Date of Service: 08/03/2019  MRN: [id number]  PCP: No Pcp  Attending: Pamalee Leyden, MD      Arrival: The patient arrived by ambulance and is alone.  History Obtained by: provider  History Limitations: none    Chief Complaint   Patient presents with   . Postpartum Complication     Reports having pain s/p pregnancy x 3 weeks ago.  Pt reports bleeding more than normal.  States sharpe pain       HPI:  Summer Patel is a 34 y.o. Unknown female presenting with L flank pain that began today while pt was doing laundry. Pt is G4P3 and had a vaginal delivery of a baby girl at [redacted] weeks gestation, 3 weeks ago. Pt reports gestational diabetes, but denies pre-eclampsia or gestational HTN. Pt has had vaginal bleeding since her delivery, but states that her amount of bleeding has doubled since the onset of her flank pain. She also complains of headache without vision changes. Pt notes hx of migraines. She denies dysuria, nausea, vomiting, blood in her stool, diarrhea, constipation, lightheadedness, or dizziness. Pt reports daily BM. No other complaints at this time.    ROS:   Constitutional: No fever, chills or weakness   Skin: No rash or diaphoresis  HENT: No congestion  Eyes: No vision changes or photophobia   Cardio: No chest pain, palpitations or leg swelling   Respiratory: No cough, wheezing or SOB  GI:  No nausea, vomiting or stool changes  GU:  No dysuria, hematuria, or increased frequency +vaginal bleeding  MSK: No muscle aches, joint pain +L flank pain  Neuro: No seizures, LOC, numbness, tingling, or focal weakness +headache  All other systems reviewed and are negative.      Below pertinent information reviewed with patient:  History reviewed. No pertinent past medical history.    Medications Prior to Admission     Prescriptions    ferrous sulfate (FERATAB) 324 mg (65 mg iron) Oral  Tablet, Delayed Release (E.C.)    Take 1 Tablet (324 mg total) by mouth Every other day for 90 days    Ibuprofen (MOTRIN) 600 mg Oral Tablet    Take 1 Tablet (600 mg total) by mouth Every 6 hours for 34 days    prenatal vitamin-iron-folate Tablet    Take 1 Tablet by mouth Once a day          No Known Allergies    History reviewed. No pertinent surgical history.    Family Medical History:     Problem Relation (Age of Onset)    Diabetes Mother, General Family Hx, Other    Heart Disease Father    Hypertension (High Blood Pressure) Father          Social History     Socioeconomic History   . Marital status: Married     Spouse name: Avrie Kedzierski   . Number of children: 2   . Years of education: 70   . Highest education level: Not on file   Occupational History   . Not on file   Tobacco Use   . Smoking status: Never Smoker   . Smokeless tobacco: Never Used   Vaping Use   . Vaping Use: Never used   Substance and Sexual Activity   . Alcohol use: Never   . Drug use:  Never   . Sexual activity: Yes     Partners: Male     Birth control/protection: None   Other Topics Concern   . Not on file   Social History Narrative   . Not on file     Social Determinants of Health     Financial Resource Strain:    . Difficulty of Paying Living Expenses:    Food Insecurity:    . Worried About Charity fundraiser in the Last Year:    . Arboriculturist in the Last Year:    Transportation Needs:    . Film/video editor (Medical):    Marland Kitchen Lack of Transportation (Non-Medical):    Physical Activity:    . Days of Exercise per Week:    . Minutes of Exercise per Session:    Stress:    . Feeling of Stress :    Intimate Partner Violence:    . Fear of Current or Ex-Partner:    . Emotionally Abused:    Marland Kitchen Physically Abused:    . Sexually Abused:          Objective:   ED Triage Vitals [08/03/19 1727]   BP (Non-Invasive) (!) 167/11   Heart Rate 74   Respiratory Rate 18   Temp    SpO2 97 %   Weight 122 kg (270 lb)   Height 1.727 m (_0 )       Nursing  notes and vital signs reviewed.    Constitutional: Pleasant 34 y.o. female appears stated age in good health, in no acute distress, normal color, no cyanosis.   HENT:   Head: Normocephalic and atraumatic.   Mouth/Throat: Oropharynx is clear and moist.   Eyes: EOMI, PERRL   Neck: Trachea midline. Neck supple.  Cardiovascular: RRR, No murmurs, rubs or gallops. Intact distal pulses.  Pulmonary/Chest: BS equal bilaterally. No respiratory distress. No wheezes, rales or chest tenderness.   Abdominal: BS +. Abdomen soft, no tenderness, rebound or guarding.  Back: Mild L CVA TTP.No midline spinal tenderness, no paraspinal tenderness.  Musculoskeletal: Trace BLE edema. No tenderness or deformity.  Skin: warm and dry. No rash, erythema, pallor or cyanosis  Psychiatric: normal mood and affect. Behavior is normal.   Neurological: Patient keenly alert and responsive, CN II-XII grossly intact, moving all extremities equally and fully    Labs:   Labs Reviewed   HEPATIC FUNCTION PANEL - Abnormal; Notable for the following components:       Result Value    BILIRUBIN TOTAL 0.2 (*)     All other components within normal limits   URINALYSIS, MACRO/MICRO - Abnormal; Notable for the following components:    LEUKOCYTES 500  (*)     BLOOD 3+ (*)     RBCS 30-50 (*)     WBCS 75-100 (*)     All other components within normal limits   LIPASE - Normal   URINE CULTURE,ROUTINE   BASIC METABOLIC PANEL    Narrative:     Estimated Glomerular Filtration Rate (eGFR) calculated using the CKD-EPI (2009) equation, intended for patients 65 years of age and older. If race and/or gender is not documented or "unknown," there will be no eGFR calculation.   CBC/DIFF    Narrative:     The following orders were created for panel order CBC/DIFF.  Procedure  Abnormality         Status                     ---------                               -----------         ------                     CBC WITH QQIW[[id number]]                                     Final result                 Please view results for these tests on the individual orders.   URINALYSIS WITH REFLEX MICROSCOPIC AND CULTURE IF POSITIVE    Narrative:     The following orders were created for panel order URINALYSIS WITH REFLEX MICROSCOPIC AND CULTURE IF POSITIVE.  Procedure                               Abnormality         Status                     ---------                               -----------         ------                     URINALYSIS, MACRO/MICRO[[id number]]      Abnormal            Final result                 Please view results for these tests on the individual orders.   HCG, PLASMA OR SERUM QUANTITATIVE, PREGNANCY   CBC WITH DIFF   PROTEIN/CREATININE RATIO, URINE, RANDOM       Imaging:  CT ABDOMEN PELVIS W IV CONTRAST   Final Result   1. No acute abnormality. No inflammatory changes.   2. Enlargement of the right hepatic lobe.         Radiologist location ID: [id number]         US KIDNEY   Final Result   1. No sonographic abnormality of the kidneys or bladder.   2. Enlargement of the right hepatic lobe.         Radiologist location ID: [id number]         [place] FEMALE PELVIS   Final Result   1. No definite abnormality of the endometrium.   2. Small right ovarian cyst.         Radiologist location ID: [facility]               Chief Complaint   Patient presents with   . Postpartum Complication     Reports having pain s/p pregnancy x 3 weeks ago.  Pt reports bleeding more than normal.  States sharpe pain       Clinical Impression:     Encounter Diagnoses   Name Primary?   . Left flank pain Yes   . Vaginal bleeding    .  UTI (urinary tract infection)        MDM/Course:  Glessie Eustice is a 34 y.o. female who is s/p spontaneous vaginal delivery 3 weeks prior who presented with vaginal bleeding.  On initial presentation, patient hypertensive to 662 systolic, which improved to 142/82 during ED stay.  Patient with mild left CVA tenderness on exam.  UA positive for UTI.  LFTs within normal  limits.  Protein creatinine ratio within normal limits.  Renal ultrasound and ultrasound female pelvis with no abnormalities, incidental small right ovarian cyst noted.  CT abdomen pelvis with IV contrast with no evidence of acute abnormality.  Given left CVA tenderness and UTI, patient started on p.o. cefdinir for treatment of pyelonephritis.  Patient instructed to follow-up with her OB as soon as possible for visit.  Findings discussed with the patient and her husband who stated understanding.  Patient provided opportunity to ask questions.  All questions were answered.  Prescription for cefdinir sent to patient's pharmacy.  Return precautions thoroughly discussed including worsening pain, fever, lightheadedness, dizziness, chest pain, shortness of breath.  Patient in agreement with plan.  Patient discharged home.         Medications given:  Medications   NS 10 mL injection (has no administration in time range)   acetaminophen (TYLENOL) tablet (1,000 mg Oral Given 08/03/19 1948)   ioversol (OPTIRAY 320) infusion (120 mL Intravenous Given 08/03/19 1937)   cefdinir (OMNICEF) capsule (300 mg Oral Given 08/03/19 2043)       Following the below history, physical exam, and studies, the patient was deemed stable and suitable for discharge. The patient was advised to return to the ED for any new or worsening symptoms. Discharge medications, and follow-up instructions were discussed with the patient in detail, who verbalizes understanding. The patient is in agreement and is comfortable with the plan of care.      Disposition: Discharged    Discharge Medication List as of 08/03/2019  8:18 PM      START taking these medications    Details   cefdinir (OMNICEF) 300 mg Oral Capsule Take 1 Capsule (300 mg total) by mouth Twice daily for 10 days, Disp-20 Capsule, R-0, Print             Follow up:    [facility] - Emergency Department  [place] [address]  [phone]    As needed, If  symptoms worsen    -------------------------------------------------------------------------------------------------------------------------------------------      Patient seen by and discussed with attending physician, Pamalee Leyden, MD.    Parts of this patients chart were completed in a retrospective fashion due to simultaneous direct patient care activities in the Emergency Department.   This note was partially generated using MModal Fluency Direct system, and there may be some incorrect words, spellings, and punctuation that were not noted in checking the note before saving.     I am scribing for, and in the presence of, Dr. Eustace Pen Emily Forse for services provided on 08/03/2019.  Riccardo Dubin Deneen, [place] Deneen, SCRIBE  08/03/2019, 18:25    I personally performed the services described in this documentation, as scribed  in my presence, and it is both accurate  and complete.    Billey Chang, MD    Billey Chang, MD  08/03/2019, 22:42      Billey Chang, MD 08/03/2019 18:15  PGY-3 Emergency Medicine  [facility] Medicine  Pager # - [facility]

## 2019-08-03 NOTE — Discharge Instructions (Signed)
Today you were seen in the ER for left flank pain. You urine was positive for a urinary tract infection. With your left flank pain you may have an early kidney infection. You were given a dose of antibiotics in the ER. You were also given a prescription for antibiotics. Please take as prescribed. Please follow up with your regular doctor. Please return to the ER for increased pain, fever, vomiting, or any other concerns.

## 2019-08-03 NOTE — Nursing Note (Signed)
I spoke to patient, she has an appointment for PPV with Dr.Mace on 08/13/19, it was changed to a video visit because of child care issues, patient requested to be scheduled with [facility], appointment was changed to Glenis Smoker CNP on 08/20/19 at 11 PM for a video visit, patient was pleased and voiced understanding.

## 2019-08-06 LAB — URINE CULTURE,ROUTINE: URINE CULTURE: NO GROWTH

## 2019-08-13 ENCOUNTER — Encounter (INDEPENDENT_AMBULATORY_CARE_PROVIDER_SITE_OTHER): Payer: Self-pay | Admitting: Obstetrics & Gynecology

## 2019-08-20 ENCOUNTER — Telehealth (INDEPENDENT_AMBULATORY_CARE_PROVIDER_SITE_OTHER): Payer: 59 | Admitting: Nurse Practitioner

## 2019-08-20 ENCOUNTER — Encounter (INDEPENDENT_AMBULATORY_CARE_PROVIDER_SITE_OTHER): Payer: Self-pay | Admitting: Nurse Practitioner

## 2019-08-20 DIAGNOSIS — Z8632 Personal history of gestational diabetes: Secondary | ICD-10-CM | POA: Insufficient documentation

## 2019-08-20 DIAGNOSIS — Z3041 Encounter for surveillance of contraceptive pills: Secondary | ICD-10-CM

## 2019-08-20 DIAGNOSIS — Z4889 Encounter for other specified surgical aftercare: Secondary | ICD-10-CM

## 2019-08-20 DIAGNOSIS — Z3009 Encounter for other general counseling and advice on contraception: Secondary | ICD-10-CM

## 2019-08-20 MED ORDER — NORETHINDRONE (CONTRACEPTIVE) 0.35 MG TABLET
1.00 | ORAL_TABLET | Freq: Every day | ORAL | 5 refills | Status: AC
Start: 2019-08-20 — End: ?

## 2019-08-20 NOTE — Progress Notes (Signed)
OB/GYN, PHYSICIAN OFFICE BUILDING  [address]  [facility] [address]  [facility]  Video Visit     Name: Summer Patel  MRN: [id number]    Date: 08/20/2019  Age: 34 y.o.                          Patient's location: Home [address] [facility] [address]   Patient/family aware of provider location: Yes  Patient/family consent for video visit: Yes  Interview and observation performed by: Glenis Smoker, NP    NOTE AUTHORED BY Glenis Smoker, MSN, APRN, [facility] - per [facility] requirements.    41YS [id number] here for her post-partum exam via video visit s/p NSVD on 07/13/2019 live baby girl was 9lb 5.7oz. No c/o today. Pt is adjusting to the demands of motherhood, and has adequate help with her children. Denies s/sx Post-Partum Depression. Pt has 2 hour GTT 08/24/2019.     Last menstrual period 10/20/2018, currently breastfeeding.    Sexual activity: No  Dyspareunia: n/a  Bladder complaints:  No  Bowel complaints:   No  Lochial flow: None    Observational Exam:  Constitutional: Alert, well developed, well nourished, appears in no acute distress  Head: NC/AT. No observed lesions  Eyes:  Conjunctiva normal. EOM intact, Neck supple. No eye or nasal discharge.   Cardiovascular: No apparent JVD noted  Respiratory:  Effort normal, no accessory muscle usage. No audible wheezing noted. No stridor.   Musculoskeletal:  FROM. No deformities observed.  Neuro: normal mental status, speech normal, alert and oriented x iii,   Skin and Breast: Skin is warm and dry. No rash or erythema noted.   Psychiatric:  Pt has a normal mood and affect, behavior, judgement, and thought content.      ICD-10-CM    1. Postoperative visit  Z48.89    2. History of gestational diabetes  Z86.32    3. Encounter for other general counseling or advice on contraception  Z30.09    4. Encounter for surveillance of contraceptive pills  Z30.41 Norethindrone, Contraceptive, (equiv to: ORTHO MICRONOR) 0.35 mg Oral Tablet     Pt ed on safe sex practices and all BC  options. Ed on birth control pills, Nexplanon, IUD insertion, and Depo Provera injection. Pt choice Micronor, ed on use, start. BUM, s/sx ACHES, and normal hormonal adjustments.     Glenis Smoker, NP  08/20/2019, 11:02

## 2019-08-24 ENCOUNTER — Other Ambulatory Visit: Payer: Self-pay

## 2019-08-24 ENCOUNTER — Ambulatory Visit: Payer: 59 | Attending: Obstetrics & Gynecology

## 2019-08-24 DIAGNOSIS — O24419 Gestational diabetes mellitus in pregnancy, unspecified control: Secondary | ICD-10-CM | POA: Insufficient documentation

## 2019-08-24 LAB — GLUCOSE FASTING (GTT 2 HOUR): GLUCOSE FASTING FOR GTT: 92 mg/dL (ref 70–110)

## 2019-08-24 LAB — GLUCOSE 2 HOUR POST DOSE (GTT 2 HOUR): GLUCOSE 2 HRS POST  DOSE: 121 mg/dL — ABNORMAL HIGH (ref 70–120)

## 2019-08-25 ENCOUNTER — Encounter (INDEPENDENT_AMBULATORY_CARE_PROVIDER_SITE_OTHER): Payer: Self-pay | Admitting: Nurse Practitioner

## 2019-11-23 ENCOUNTER — Emergency Department
Admission: EM | Admit: 2019-11-23 | Discharge: 2019-11-23 | Disposition: A | Discharge status: Home / Self Care | Payer: 59 | Attending: Emergency Medicine | Admitting: Emergency Medicine

## 2019-11-23 ENCOUNTER — Other Ambulatory Visit: Payer: Self-pay

## 2019-11-23 ENCOUNTER — Encounter (HOSPITAL_COMMUNITY): Payer: Self-pay

## 2019-11-23 DIAGNOSIS — F419 Anxiety disorder, unspecified: Secondary | ICD-10-CM | POA: Insufficient documentation

## 2019-11-23 DIAGNOSIS — R4589 Other symptoms and signs involving emotional state: Secondary | ICD-10-CM

## 2019-11-23 DIAGNOSIS — O99345 Other mental disorders complicating the puerperium: Secondary | ICD-10-CM | POA: Insufficient documentation

## 2019-11-23 DIAGNOSIS — R079 Chest pain, unspecified: Secondary | ICD-10-CM

## 2019-11-23 DIAGNOSIS — F329 Major depressive disorder, single episode, unspecified: Secondary | ICD-10-CM

## 2019-11-23 DIAGNOSIS — G4700 Insomnia, unspecified: Secondary | ICD-10-CM | POA: Insufficient documentation

## 2019-11-23 DIAGNOSIS — F53 Postpartum depression: Secondary | ICD-10-CM

## 2019-11-23 LAB — CBC WITH DIFF
BASOPHIL #: 0.1 10*3/uL (ref 0.00–0.20)
BASOPHIL %: 1 %
EOSINOPHIL #: 0.1 10*3/uL (ref 0.00–0.50)
EOSINOPHIL %: 1 %
HCT: 36.3 % (ref 34.6–46.2)
HGB: 12.4 g/dL (ref 11.8–15.8)
LYMPHOCYTE #: 1.2 10*3/uL (ref 0.90–3.40)
LYMPHOCYTE %: 19 %
MCH: 29.1 pg (ref 27.6–33.2)
MCHC: 34 g/dL (ref 32.6–35.4)
MCV: 85.5 fL (ref 82.3–96.7)
MONOCYTE #: 0.3 10*3/uL (ref 0.20–0.90)
MONOCYTE %: 5 %
MPV: 7.5 fL (ref 6.6–10.2)
NEUTROPHIL #: 4.7 10*3/uL (ref 1.50–6.40)
NEUTROPHIL %: 74 %
PLATELETS: 313 10*3/uL (ref 140–440)
RBC: 4.25 10*6/uL (ref 3.80–5.24)
RDW: 14 % (ref 12.4–15.2)
WBC: 6.3 10*3/uL (ref 3.5–10.3)

## 2019-11-23 LAB — BASIC METABOLIC PANEL
ANION GAP: 9 mmol/L
BUN/CREA RATIO: 30
BUN: 14 mg/dL (ref 10–25)
CALCIUM: 9.2 mg/dL (ref 8.8–10.3)
CHLORIDE: 104 mmol/L (ref 98–111)
CO2 TOTAL: 26 mmol/L (ref 21–35)
CREATININE: 0.46 mg/dL (ref <=1.30)
GLUCOSE: 83 mg/dL (ref 70–110)
POTASSIUM: 4 mmol/L (ref 3.5–5.0)
SODIUM: 139 mmol/L (ref 135–145)

## 2019-11-23 LAB — HCG QUALITATIVE PREGNANCY, SERUM: PREGNANCY, SERUM QUALITATIVE: NEGATIVE

## 2019-11-23 LAB — TROPONIN-I: TROPONIN I: <0.03 ng/mL (ref <=0.04)

## 2019-11-23 LAB — THYROID STIMULATING HORMONE (SENSITIVE TSH): TSH: 0.982 u[IU]/mL (ref 0.450–5.330)

## 2019-11-23 NOTE — ED Provider Notes (Signed)
Department of [facility]  HPI - 11/23/2019      Advanced Practice Provider: Tracey Harries, FNP-BC  Attending Physician: Dr. Coralee North    Chief Complaint: Postpartum Depression    HPI  Summer Patel is a 34 y.o. female who presents to the ED via [facility] with c/o depression over the last 2 weeks. Patient is 3 months postpartum. Pt states hx of depression after miscarriage in 2018. Pt complains of the feeling of being scared, emotional, having anxiety, lack of concentration, forgetfulness, restlessness, insomnia, and mentally feeling weak. She reports of chest tightness and feeling like her "heart hurts." She expresses her concern for postpartum depression. Pt complains of weight loss secondary to not feeling like she wants to eat. Pt reports the feeling of not wanting to do anything. Pt states she tried to see a PCP, but they will not accept new patients for at least two weeks. Pt reports being seen about a year ago after her miscarriage for depression and headaches, for which she was prescribed amitriptyline. She reports that her symptoms improved after taking this medication. However, she discontinued it prior to getting pregnant. Denies h/o postpartum depression. She denies SI, HI, or substance abuse. Denies shortness of breath, leg swelling, cough, fevers, n/v/d, or any other complaints at this time. Denies any other PMH.     History Limitations: None    Review of Systems  Constitutional: No fever, chills or weakness +emotional +anxiety +lack of concentration +forgetfulness +restlessness +mental weakness +insomnia  Skin: No rash or diaphoresis  HENT: No headaches or congestion  Eyes: No vision changes   Cardio: No leg swelling +chest tightness   Respiratory: No cough, wheezing or SOB  GI:  No abdominal pain, nausea, vomiting or stool changes  GU:  No urinary changes  MSK: No joint or back pain  Neuro: No seizures or LOC  Psych: No SI, HI, or substance abuse. +postpartum depression  All  other systems reviewed and are negative.    History:   PMH:  History reviewed. No pertinent past medical history.    PSH:    Past Surgical History Pertinent Negatives:   Procedure Date Noted   . HX ADENOIDECTOMY 07/13/2019   . HX APPENDECTOMY 06/09/2019   . HX BLADDER REPAIR 06/09/2019   . HX BLOOD TRANSFUSION 06/09/2019   . HX BREAST BIOPSY 06/09/2019   . HX CESAREAN SECTION 06/09/2019   . HX CHOLECYSTECTOMY 06/09/2019   . HX CYSTOSCOPY 06/09/2019   . HX HYSTERECTOMY 06/09/2019   . HX TONSILLECTOMY 06/09/2019   . HX TUBAL LIGATION 06/09/2019   . PB POST COLPORRHAPHY,RECTUM/VAGINA 06/09/2019   . PB REPAIR ENTEROCELE,VAG [facility] 06/09/2019   . PB REPR VAGINAL PROLAPSE,SACROSP LIG 06/09/2019   . PB SLING OPER STRES INCONTINENCE 06/09/2019   . URETERAL STENT TO GRAVITY DRAINAGE 06/09/2019         Social Hx:    Social History     Socioeconomic History   . Marital status: Married     Spouse name: Uriel Horkey   . Number of children: 2   . Years of education: 106   . Highest education level: Not on file   Occupational History   . Not on file   Tobacco Use   . Smoking status: Never Smoker   . Smokeless tobacco: Never Used   Vaping Use   . Vaping Use: Never used   Substance and Sexual Activity   . Alcohol use: Never   . Drug  use: Never   . Sexual activity: Yes     Partners: Male     Birth control/protection: None   Other Topics Concern   . Not on file   Social History Narrative   . Not on file     Social Determinants of Health     Financial Resource Strain:    . Difficulty of Paying Living Expenses:    Food Insecurity:    . Worried About Charity fundraiser in the Last Year:    . Arboriculturist in the Last Year:    Transportation Needs:    . Film/video editor (Medical):    Marland Kitchen Lack of Transportation (Non-Medical):    Physical Activity:    . Days of Exercise per Week:    . Minutes of Exercise per Session:    Stress:    . Feeling of Stress :    Intimate Partner Violence:    . Fear of Current or Ex-Partner:    .  Emotionally Abused:    Marland Kitchen Physically Abused:    . Sexually Abused:      Family Hx:   Family History   Problem Relation Age of Onset   . Heart Disease Father    . Hypertension (High Blood Pressure) Father    . Diabetes Mother    . Diabetes General Family Hx    . Diabetes Other      Allergies: No Known Allergies  Medications:   Prior to Admission Medications   Prescriptions Last Dose Informant Patient Reported? Taking?   Norethindrone, Contraceptive, (equiv to: ORTHO MICRONOR) 0.35 mg Oral Tablet   No No   Sig: Take 1 Tablet (0.35 mg total) by mouth Once a day   prenatal vitamin-iron-folate Tablet   Yes No   Sig: Take 1 Tablet by mouth Once a day      Facility-Administered Medications: None       Above history reviewed with patient, changes are as documented.    Physical Exam   Nursing notes reviewed.    Filed Vitals:    11/23/19 0915   BP: (!) 113/99   Pulse: 88   Resp: 20   Temp: 36.4 C (97.5 F)   SpO2: 99%     Constitutional: NAD. Oriented. Tearful episodes.  HENT:   Head: Normocephalic and atraumatic.   Mouth/Throat: Oropharynx is clear and moist.   Eyes: EOMI, PERRLA.   Neck: Trachea midline. Neck supple. No lymphadenopathy.   Cardiovascular: Regular rate and rhythm. No murmurs, rubs or gallops. Intact distal pulses.  Pulmonary/Chest: BS equal bilaterally. No respiratory distress. No wheezes, rales or chest tenderness.   GI: BS +. Abdomen soft, no tenderness, rebound or guarding.         Musculoskeletal: No edema, tenderness or deformity.  Skin: Warm and dry. No rash, erythema, pallor or cyanosis  Psychiatric: Depressed and anxious mood and affect. Behavior is otherwise normal.   Neurological: Alert. Grossly intact.    Course  Orders, Abnormal Labs and Imaging Results:  Results up to the Time the Disposition was Entered   TROPONIN-I - Normal   THYROID STIMULATING HORMONE (SENSITIVE TSH) - Normal   HCG QUALITATIVE PREGNANCY, SERUM - Normal   CBC/DIFF    Narrative:     The following orders were created for panel  order CBC/DIFF.  Procedure  Abnormality         Status                     ---------                               -----------         ------                     CBC WITH BOFB[[id number]]                                    Final result                 Please view results for these tests on the individual orders.   ECG 12 LEAD - ED USE    Narrative:     -------------------------ECG Interpretation-----------------------------------  Sinus rhythm  Low voltage, precordial leads  RSR' in V1 or V2, right VCD or RVH  .  Marland Kitchen  .  MD Signature: Confirmed by: McGushin DO, Meghann 24-Nov-2019 78:24:23   BASIC METABOLIC PANEL    Narrative:     Estimated Glomerular Filtration Rate (eGFR) calculated using the CKD-EPI (2009) equation, intended for patients 7 years of age and older. If race and/or gender is not documented or "unknown," there will be no eGFR calculation.   CBC WITH DIFF       EKG: Reviewed by attending.  MDM:  Therapy/Procedures/Course/MDM:    Patient presents for depression, anxiety, insomnia.   Patient was vitally stable throughout visit.    Imaging: None indicated   Lab results: As above, reviewed by me.   Results, symptom etiology, and symptomatic treatment discussed with patient.   Plan: Discharge home. Scheduled appointment today at 3:00 for patient at Dr. Alferd Patee, Psychiatry's office.       Advised the patient return to the ED if patient begins to develop any worsening symptoms.   Patient voiced understanding and was agreeable to the plan   she was given the opportunity to ask questions.      ED Course as of Dec 02 933   Mon Nov 23, 2019   1241 Spoke with Dr. Alferd Patee. States can see patient in his office at 3:00 today. Patient notified.    ER return precautions discussed. Pt verbalized understanding.     [JC]      ED Course User Index  [JC] Filicia Scogin, Janett Billow, FNP-BC     Consults: see ED Course  Impression:   Encounter Diagnoses   Name Primary?   . Symptoms of depression Yes   .  Anxiety      Disposition:  Discharged    Discharge: Following the above history, physical exam, and studies, the patient was deemed stable and suitable for discharge. Patient was discharged with no new medications and will return to [facility] ED as needed. Discharge medications and follow-up instructions were discussed with patient/patient's family. It was advised that the patient return to the ED if they develop new or any other concerning symptoms. The patient/patient's family verbalized understanding of all instructions and had no further questions or concerns.    Follow Up:   [facility] - Emergency Department  [place] [address]  [phone]    As needed, If symptoms worsen    Newell Coral,  MD  [address]  [address]  [place] [address]  [phone]      You have an appointment at 3:00 today.    Prescriptions:   Discharge Medication List as of 11/23/2019 12:45 PM           No future appointments.    The co-signing faculty was physically present in the emergency department and available for consultation and did not participate in the care of this patient.    I am scribing for, and in the presence of, Tracey Harries FNP-BC for services provided on 11/23/2019.  Delphia Grates, SCRIBE   Delphia Grates, [place]  11/23/2019, 09:52    I personally performed the services described in this documentation, as scribed  in my presence, and it is both accurate  and complete.    Tracey Harries, FNP-BC

## 2019-11-23 NOTE — ED Nurses Note (Signed)
Pt d/c to POV, AVS given to patient.  This nurse was not primary nurse involved in this patients care.  Pt discharged from computer by this nurse due to heavy patient load in department.

## 2019-11-24 LAB — ECG 12 LEAD - ED USE
Calculated P Axis: 41 deg
Calculated T Axis: 20 deg
Heart Rate: 78 {beats}/min
I 40 Axis: 104 deg
PR Interval: 165 ms
QRS Axis: -9 deg
QRS Duration: 102 ms
QT Interval: 383 ms
QTC Calculation: 437 ms
ST Axis: 62 deg
T 40 Axis: -56 deg

## 2020-04-19 ENCOUNTER — Ambulatory Visit (INDEPENDENT_AMBULATORY_CARE_PROVIDER_SITE_OTHER): Payer: Self-pay | Admitting: Nurse Practitioner

## 2020-04-19 NOTE — Telephone Encounter (Signed)
-----   Message from Glenis Smoker, NP sent at 04/19/2020  1:33 PM EST -----  ----- Message from Palma Holter sent at 04/19/2020 10:07 AM EST -----  Lorin Picket pt    The pt is calling asking for an order for a covid test order so she can fly internationally. She states she needs a special one for traveling and has to have an order from a provider. She states she does not have a PCP.       Thank you

## 2020-04-19 NOTE — Telephone Encounter (Signed)
Pt has not been seen since 08/2019, advised could do urgebnt care appt from MyChart app for order or [facility] may be offering, verbalized understanding.

## 2020-04-20 ENCOUNTER — Other Ambulatory Visit (HOSPITAL_COMMUNITY): Payer: Self-pay

## 2020-04-20 ENCOUNTER — Encounter (INDEPENDENT_AMBULATORY_CARE_PROVIDER_SITE_OTHER): Payer: 59 | Admitting: Family

## 2020-04-20 DIAGNOSIS — Z1152 Encounter for screening for COVID-19: Secondary | ICD-10-CM

## 2020-04-23 ENCOUNTER — Ambulatory Visit: Payer: 59 | Attending: PSYCHIATRY & NEUROLOGY, PSYCHIATRY

## 2020-04-23 DIAGNOSIS — Z1152 Encounter for screening for COVID-19: Secondary | ICD-10-CM

## 2020-04-23 DIAGNOSIS — Z20822 Contact with and (suspected) exposure to covid-19: Secondary | ICD-10-CM | POA: Insufficient documentation

## 2020-04-23 LAB — COVID-19 BDMAX - LAB USE ONLY: SARS-COV-2: NOT DETECTED

## 2021-01-23 ENCOUNTER — Ambulatory Visit (INDEPENDENT_AMBULATORY_CARE_PROVIDER_SITE_OTHER): Payer: No Typology Code available for payment source | Admitting: Family Medicine

## 2021-01-31 ENCOUNTER — Encounter (INDEPENDENT_AMBULATORY_CARE_PROVIDER_SITE_OTHER): Payer: Self-pay | Admitting: Family Medicine

## 2021-01-31 ENCOUNTER — Ambulatory Visit (FREE_STANDING_LABORATORY_FACILITY): Payer: No Typology Code available for payment source | Admitting: Family Medicine

## 2021-01-31 VITALS — BP 126/86 | HR 102 | Temp 98.2°F | Resp 15 | Ht 67.0 in | Wt 269.0 lb

## 2021-01-31 DIAGNOSIS — Z0000 Encounter for general adult medical examination without abnormal findings: Secondary | ICD-10-CM

## 2021-01-31 DIAGNOSIS — R109 Unspecified abdominal pain: Secondary | ICD-10-CM

## 2021-01-31 DIAGNOSIS — F53 Postpartum depression: Secondary | ICD-10-CM

## 2021-01-31 DIAGNOSIS — R748 Abnormal levels of other serum enzymes: Secondary | ICD-10-CM

## 2021-01-31 DIAGNOSIS — Z6841 Body Mass Index (BMI) 40.0 and over, adult: Secondary | ICD-10-CM

## 2021-01-31 LAB — COMPREHENSIVE METABOLIC PANEL
ALT: 16 U/L (ref 0–55)
AST (SGOT): 15 U/L (ref 5–41)
Albumin/Globulin Ratio: 1.1 (ref 0.9–2.2)
Albumin: 3.9 g/dL (ref 3.5–5.0)
Alkaline Phosphatase: 86 U/L (ref 37–117)
Anion Gap: 9 (ref 5.0–15.0)
BUN: 14 mg/dL (ref 7.0–21.0)
Bilirubin, Total: 0.3 mg/dL (ref 0.2–1.2)
CO2: 25 mEq/L (ref 17–29)
Calcium: 8.9 mg/dL (ref 8.5–10.5)
Chloride: 105 mEq/L (ref 99–111)
Creatinine: 0.6 mg/dL (ref 0.4–1.0)
Globulin: 3.5 g/dL (ref 2.0–3.6)
Glucose: 98 mg/dL (ref 70–100)
Potassium: 4.6 mEq/L (ref 3.5–5.3)
Protein, Total: 7.4 g/dL (ref 6.0–8.3)
Sodium: 139 mEq/L (ref 135–145)

## 2021-01-31 LAB — CBC AND DIFFERENTIAL
Absolute NRBC: 0 10*3/uL (ref 0.00–0.00)
Basophils Absolute Automated: 0.05 10*3/uL (ref 0.00–0.08)
Basophils Automated: 0.8 %
Eosinophils Absolute Automated: 0.1 10*3/uL (ref 0.00–0.44)
Eosinophils Automated: 1.6 %
Hematocrit: 38.7 % (ref 34.7–43.7)
Hgb: 11.9 g/dL (ref 11.4–14.8)
Immature Granulocytes Absolute: 0.02 10*3/uL (ref 0.00–0.07)
Immature Granulocytes: 0.3 %
Lymphocytes Absolute Automated: 1.86 10*3/uL (ref 0.42–3.22)
Lymphocytes Automated: 29.4 %
MCH: 26.9 pg (ref 25.1–33.5)
MCHC: 30.7 g/dL — ABNORMAL LOW (ref 31.5–35.8)
MCV: 87.6 fL (ref 78.0–96.0)
MPV: 9.9 fL (ref 8.9–12.5)
Monocytes Absolute Automated: 0.34 10*3/uL (ref 0.21–0.85)
Monocytes: 5.4 %
Neutrophils Absolute: 3.95 10*3/uL (ref 1.10–6.33)
Neutrophils: 62.5 %
Nucleated RBC: 0 /100 WBC (ref 0.0–0.0)
Platelets: 371 10*3/uL — ABNORMAL HIGH (ref 142–346)
RBC: 4.42 10*6/uL (ref 3.90–5.10)
RDW: 13 % (ref 11–15)
WBC: 6.32 10*3/uL (ref 3.10–9.50)

## 2021-01-31 LAB — LIPID PANEL
Cholesterol / HDL Ratio: 2.8 Index
Cholesterol: 144 mg/dL (ref 0–199)
HDL: 51 mg/dL (ref 40–9999)
LDL Calculated: 79 mg/dL (ref 0–99)
Triglycerides: 71 mg/dL (ref 34–149)
VLDL Calculated: 14 mg/dL (ref 10–40)

## 2021-01-31 LAB — POCT URINALYSIS AUTOMATED (IAH)
Bilirubin, UA POCT: NEGATIVE
Blood, UA POCT: NEGATIVE
Glucose, UA POCT: NEGATIVE
Ketones, UA POCT: NEGATIVE mg/dL
Nitrite, UA POCT: NEGATIVE
PH, UA POCT: 6 (ref 4.6–8)
Protein, UA POCT: NEGATIVE mg/dL
Specific Gravity, UA POCT: 1.025 mg/dL (ref 1.001–1.035)
Urine Leukocytes POCT: NEGATIVE
Urobilinogen, UA POCT: 0.2 mg/dL

## 2021-01-31 LAB — TSH: TSH: 1.27 u[IU]/mL (ref 0.35–4.94)

## 2021-01-31 LAB — HEMOGLOBIN A1C
Average Estimated Glucose: 111.2 mg/dL
Hemoglobin A1C: 5.5 % (ref 4.6–5.9)

## 2021-01-31 LAB — GFR: EGFR: >60

## 2021-01-31 LAB — HEMOLYSIS INDEX: Hemolysis Index: 3 Index (ref 0–24)

## 2021-01-31 MED ORDER — SERTRALINE HCL 100 MG PO TABS
100.0000 mg | ORAL_TABLET | Freq: Every day | ORAL | 1 refills | Status: AC
Start: 2021-01-31 — End: 2021-05-01

## 2021-01-31 NOTE — Progress Notes (Signed)
[facility]                            Date of Exam: 01/31/2021 10:35 AM        Patient ID: Carrie Murray is a 35 y.o. female.  Attending Physician: Gasper Lloyd, MD        Chief Complaint:    Wellness exam            HPI:    35 year old female patient presents for annual wellness exam.  Patient is fasting.  Would like to discuss persistent flank pain since giving birth.  Has been to ER for this several times and work up is always normal.    Wants to  try for another pregnancy , has 3 daughters.During last pregnancy , she had diabetes during her pregnancy.    Many times this year had b/l flank pain .Marland Kitchen Pain lasts for 2 hours.has no pain flank pain today     On zoloft for postpartum depression       Visit Type: Health Maintenance Visit  Reported Diet: not following any diet  Reported Exercise: none  Dental: in the last 6 months, no  Vision: in the last year, no  Immunization Status:Tetanus:  up to date  Shingles if 50 and above- not indicated    Colon Cancer Screening: (50-75): not indicated  Breast Cancer Screening: not indicated  Cervical Cancer Screening (21-65):  up to date done 2 years ago    Family hx of breast cancer: none            Problem List:    There is no problem list on file for this patient.            Current Meds:    Outpatient Medications Marked as Taking for the 01/31/21 encounter (Office Visit) with Gasper Lloyd, MD   Medication Sig Dispense Refill    folic acid (FOLVITE) 800 MCG tablet Take 800 mcg by mouth daily      hydrOXYzine (ATARAX) 10 MG tablet Take 10 mg by mouth 2 (two) times daily as needed      [DISCONTINUED] sertraline (ZOLOFT) 100 MG tablet Take 100 mg by mouth daily            Allergies:    No Known Allergies          Past Surgical History:    History reviewed. No pertinent surgical history.        Family History:    Family History   Problem Relation Age of Onset    Diabetes Mother     Heart disease Father     Hypertension Father             Social History:    Social History     Tobacco Use    Smoking status: Never     Passive exposure: Never    Smokeless tobacco: Never   Substance Use Topics    Alcohol use: Never    Drug use: Never           The following sections were reviewed this encounter by the provider:            Vital Signs:    BP 126/86 (BP Site: Left arm, Patient Position: Sitting, Cuff Size: Large)   Pulse (!) 102   Temp 98.2 F (36.8 C) (Tympanic)   Resp 15   Ht 1.702 m (5\' 7" )  Wt 122 kg (269 lb)   LMP 01/25/2021   BMI 42.13 kg/m          ROS:    No feeling of depression and anxiety.       No headache.       No trouble with vision.       No trouble with hearing.       No heartburn or difficulty swallowing.       No chest pain, unusual SOB or unusual fatigue.       No dizziness or fainting.       No abdominal pain, change in stool, blood in the stool or black tarry stools.        No dysuria or blood in the urine.          No joint pain or injury.       No rash or change in skin lesion.        Physical Exam:    General: Patient is non-ill appearing and alert.    HEENT:  NCAT, Pupils are equal and round, sclera non-icteric, Neck supple with NROM.  Thyroid does not appear enlarged.       CV: Heart is regular in rate, no murmur and no JVD.    RESP: Lungs are clear to auscultation an there are no rales, wheezes or rhonchi.      GI: Abdomen is soft and non-tender.     EXT: The extremities are without pitting edema.  No joint swelling.    DERM: Skin is without rash.  No suspicious lesions.      NEURO: Neurologically there is no focal deficit and gait is normal.       PSYC: Mood and affect are appropriate        Assessment:    1. Well woman exam without gynecological exam  - CBC and differential  - Comprehensive metabolic panel  - Hemoglobin A1C  - Lipid panel  - TSH    2. Postpartum depression  - sertraline (ZOLOFT) 100 MG tablet; Take 1 tablet (100 mg) by mouth daily  Dispense: 90 tablet; Refill: 1    3. Class 3 severe obesity due  to excess calories without serious comorbidity with body mass index (BMI) of 40.0 to 44.9 in adult    4. Elevated liver enzymes    5. Flank pain  - POCT UA Clinitek AX (urine dipstick)          Plan:    Lifestyle discussed: Healthy diet rich in fruits and vegetables, whole grains and lean meats, scarce in sugars, carbs, processed foods.   Exercise 30 minutes at least five days a week. (total of 150 minutes per week).    Wear Sunscreen in the sun, helmets on every bike ride and seat belts for every car ride.   Age appropriate screening tests discussed and recommended  Labs  Refill on zoloft -on the meidcation for more than 1 year.  Labs           Follow-up:    In 3 months         Gasper Lloyd, MD

## 2021-02-07 ENCOUNTER — Telehealth (INDEPENDENT_AMBULATORY_CARE_PROVIDER_SITE_OTHER): Payer: Self-pay | Admitting: Family Medicine

## 2021-02-07 NOTE — Telephone Encounter (Signed)
Spoke to patient regarding labs. Patient informed of results and verbalized understanding. No further questions or concerns.

## 2021-02-07 NOTE — Telephone Encounter (Signed)
Name of Requester: patient  Clinical Question: called to speak with a nurse about her lab results   Last DOS: 01/31/21  Requester's preferred ph: [phone]  Please note: Advised patient that the nurse left her a detailed message, said it's just a missed call no message.    Please assist.

## 2021-08-28 ENCOUNTER — Telehealth (INDEPENDENT_AMBULATORY_CARE_PROVIDER_SITE_OTHER): Payer: Self-pay | Admitting: Legal Medicine

## 2021-08-28 NOTE — Telephone Encounter (Signed)
Refill Request:    Last Office Visit (Less than 6 months): 01-31-21  (If greater than 6 months- NEEDS OV SCHEDULED)    Scheduled or declined: 09-13-21  Reason for declining OV:     Prescriber: Jan  Medication:   Name: Sertraline 100mg    Directions: 1 Daily  How many days left of meds: 2  Refills needed: no  Medication Questions/Comments:(If this case is regarding a prior auth for a medication, please ask if there is a prior auth phone number that was provided and add it under Comments.)

## 2021-08-28 NOTE — Telephone Encounter (Signed)
Patient is asking for a temp refill on sertraline 100 mg. She is scheduled 09/13/2021.

## 2021-08-29 ENCOUNTER — Other Ambulatory Visit (INDEPENDENT_AMBULATORY_CARE_PROVIDER_SITE_OTHER): Payer: Self-pay | Admitting: Legal Medicine

## 2021-08-29 DIAGNOSIS — F53 Postpartum depression: Secondary | ICD-10-CM

## 2021-08-29 MED ORDER — SERTRALINE HCL 100 MG PO TABS
100.0000 mg | ORAL_TABLET | Freq: Every day | ORAL | 0 refills | Status: AC
Start: 2021-08-29 — End: ?

## 2021-08-29 NOTE — Telephone Encounter (Signed)
Patient Notified that we sent medication to her pharmacy ( [facility] in [place]), verbalized understanding, and has no further questions at this time.

## 2021-09-13 ENCOUNTER — Ambulatory Visit (INDEPENDENT_AMBULATORY_CARE_PROVIDER_SITE_OTHER): Payer: No Typology Code available for payment source | Admitting: Legal Medicine

## 2021-09-18 ENCOUNTER — Other Ambulatory Visit (INDEPENDENT_AMBULATORY_CARE_PROVIDER_SITE_OTHER): Payer: Self-pay | Admitting: Legal Medicine

## 2021-09-18 DIAGNOSIS — F53 Postpartum depression: Secondary | ICD-10-CM

## 2021-09-18 NOTE — Telephone Encounter (Signed)
Needs office visit for further refills
